# Patient Record
Sex: Female | Born: 1980 | Race: White | Hispanic: No | Marital: Married | Smoking: Current every day smoker
Health system: Southern US, Community
[De-identification: ages and names within clinical notes are randomized; demographics above are authoritative.]

## PROBLEM LIST (undated history)

## (undated) DIAGNOSIS — Z87442 Personal history of urinary calculi: Secondary | ICD-10-CM

## (undated) DIAGNOSIS — A630 Anogenital (venereal) warts: Secondary | ICD-10-CM

## (undated) DIAGNOSIS — R3915 Urgency of urination: Secondary | ICD-10-CM

## (undated) DIAGNOSIS — F419 Anxiety disorder, unspecified: Secondary | ICD-10-CM

## (undated) DIAGNOSIS — N200 Calculus of kidney: Secondary | ICD-10-CM

## (undated) HISTORY — DX: Anogenital (venereal) warts: A63.0

---
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
## 2004-05-23 ENCOUNTER — Ambulatory Visit (HOSPITAL_COMMUNITY): Admission: RE | Admit: 2004-05-23 | Discharge: 2004-05-23 | Payer: Self-pay | Admitting: Obstetrics and Gynecology

## 2004-05-27 ENCOUNTER — Emergency Department (HOSPITAL_COMMUNITY): Admission: EM | Admit: 2004-05-27 | Discharge: 2004-05-27 | Payer: Self-pay | Admitting: Emergency Medicine

## 2004-11-01 ENCOUNTER — Ambulatory Visit (HOSPITAL_COMMUNITY): Admission: AD | Admit: 2004-11-01 | Discharge: 2004-11-01 | Payer: Self-pay | Admitting: Obstetrics & Gynecology

## 2004-11-01 ENCOUNTER — Inpatient Hospital Stay (HOSPITAL_COMMUNITY): Admission: AD | Admit: 2004-11-01 | Discharge: 2004-11-04 | Payer: Self-pay | Admitting: Obstetrics and Gynecology

## 2008-02-17 ENCOUNTER — Other Ambulatory Visit: Admission: RE | Admit: 2008-02-17 | Discharge: 2008-02-17 | Payer: Self-pay | Admitting: Obstetrics & Gynecology

## 2009-02-20 ENCOUNTER — Other Ambulatory Visit: Admission: RE | Admit: 2009-02-20 | Discharge: 2009-02-20 | Payer: Self-pay | Admitting: Obstetrics & Gynecology

## 2010-06-17 ENCOUNTER — Other Ambulatory Visit (HOSPITAL_COMMUNITY)
Admission: RE | Admit: 2010-06-17 | Discharge: 2010-06-17 | Disposition: A | Discharge status: Home / Self Care | Payer: Self-pay | Source: Ambulatory Visit | Attending: Obstetrics & Gynecology | Admitting: Obstetrics & Gynecology

## 2010-06-17 DIAGNOSIS — Z01419 Encounter for gynecological examination (general) (routine) without abnormal findings: Secondary | ICD-10-CM | POA: Insufficient documentation

## 2010-09-13 NOTE — H&P (Signed)
Stacey Wilson, Stacey Wilson                ACCOUNT NO.:  [id number]   MEDICAL RECORD NO.:  [id number]          PATIENT TYPE:  INP   LOCATION:  LDR4                          FACILITY:  [facility]   PHYSICIAN:  Langley Gauss, MD     DATE OF BIRTH:  [birth year]   DATE OF ADMISSION:  11/01/2004  DATE OF DISCHARGE:  LH                                HISTORY & PHYSICAL   This is a 30 year old gravida 1, para 0 at 39 weeks' gestation.  The patient  received prenatal care through [facility].  She has had an  uncomplicated prenatal course.  Is noted to be a GBS carrier status  negative.  The patient presents p.m. November 01, 2004 complaining of an  increased frequency of intensity of uterine contractions.  The patient had  been seen in labor and delivery the a.m. of November 01, 2004, noted to not be in  active labor at that time, so was discharged to home.  She states the  contractions did subside during the day, but throughout the day intensity  and frequency did increase prompting her visit to labor and delivery.  She  denies any ruptured membranes, any vaginal bleeding, no leakage of fluid,  good fetal movement.  Contractions occurring about every 5 minutes.   PAST MEDICAL HISTORY:  The patient was treated once during the pregnancy for  a urinary tract infection.  The patient does have a history of prior vaginal  pain which makes her very apprehensive regarding any pelvic examinations.   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   PHYSICAL EXAMINATION:  GENERAL: On presentation she is noted to be in some  obvious discomfort with the uterine contractions.  Blood pressure is 125/70,  pulse rate of 80, respiratory rate 20.  HEENT: Negative.  No adenopathy.  NECK: Supple.  Thyroid is nonpalpable.  LUNGS: Clear.  CARDIOVASCULAR: Regular rate and rhythm.  ABDOMEN: Soft and nontender.  No surgical scars are identified.  She is  vertex on presentation by Leopold's maneuvers  with a fundal height of 38 cm.  PELVIC: Normal external genitalia.  No lesions or ulcerations identified.  No vaginal bleeding.  No leakage of fluid.  Cervix 3 cm dilated, about 60%  effaced with the head nonengaged.  External fetal monitor reveals a  reassuring fetal heart rate.  Uterine activity is every 3-5 minutes.   ASSESSMENT AND PLAN:  Term intrauterine pregnancy, gravida 1, para 0.  The  patient represents now in active labor.  The patient admitted.  She will  initially like a trial of narcotics, but she does plan ultimately on  receiving epidural analgesic.     DC/MEDQ  D:  11/02/2004  T:  11/02/2004  Job:  [id number]

## 2010-09-13 NOTE — Op Note (Signed)
Stacey Wilson, Stacey Wilson                ACCOUNT NO.:  [id number]   MEDICAL RECORD NO.:  [id number]          PATIENT TYPE:  INP   LOCATION:  LDR4                          FACILITY:  [facility]   PHYSICIAN:  Langley Gauss, MD     DATE OF BIRTH:  [birth year]   DATE OF PROCEDURE:  11/02/2004  DATE OF DISCHARGE:                                 OPERATIVE REPORT   PROCEDURE:  Placement of continuous lumbar epidural analgesia at the L3-L4  interspace performed by Dr. Langley Gauss.   COMPLICATIONS:  None.   SUMMARY:  Appropriate, informed consent was obtained.  The patient requested  epidural.  She was placed in a seated position.  Fetal heart rate remains  reassuring.  Bony landmarks were identified.  The L3-L4 interspace is  chosen.  The patient's back is sterilely prepped and draped utilizing the  epidural drape.  Five mL of 1% lidocaine plain was injected at the midline  in the L3-L4 interspace to raise a small skin wheel. The 17-gauge Tuohy  Shliff needle was then utilized with loss of resistance in an air-filled  syringe to atraumatically identify entry into the epidural space on the  first attempt without difficulty.  Initial test dose of 5 mL of 1.5%  lidocaine plus epinephrine was injected through the epidural catheter with  no signs of CSF.  Intravascular injection obtained.  The needle was removed.  Aspiration test is negative.  Second test dose of 2 mL of 1.5% lidocaine  plus epinephrine injected, and again, no signs of CSF or intravascular  injection obtained.  Thus, the patient was connected to the infusion pump  with a standard mixture.  She is treated with continuous infusion rate of 14  mL/hr.  Upon return to the lateral supine position with the catheter secured  into place, she is noted to have evidence of an excellent bilateral block in  place.  The cervix on examination is 7 mm dilated, completely effaced,  vertex at 0 station.  External fetal monitor is replaced with a fetal  scalp  monitor which documents reassuring fetal heart rate.  The patient continues  to contract every 3-5 minutes.       DC/MEDQ  D:  11/02/2004  T:  11/02/2004  Job:  [id number]

## 2010-09-13 NOTE — Op Note (Signed)
Stacey Wilson, Stacey Wilson                ACCOUNT NO.:  [id number]   MEDICAL RECORD NO.:  [id number]          PATIENT TYPE:  INP   LOCATION:  A410                          FACILITY:  [facility]   PHYSICIAN:  Langley Gauss, MD     DATE OF BIRTH:  27-Aug-1980   DATE OF PROCEDURE:  DATE OF DISCHARGE:                                 OPERATIVE REPORT   DIAGNOSIS:  40-week intrauterine pregnancy.   PROCEDURES PERFORMED:  1.  Precipitous nurse-assisted vaginal delivery of an 8 pound 2 ounce female      infant.  2.  Repair of second-degree perineal laceration with extension into the      vaginal wall, a length of about 3 inches.   ANALGESIA:  The patient of placement continuous lumbar epidural during the  course of labor. At time of delivery and repair of the second-degree  perineal laceration, a total of 30 mL of 1% lidocaine plain was injected.   ESTIMATED BLOOD LOSS:  Less than 500 mL.   DELIVERY PERFORMED BY:  Langley Gauss, M.D.   SPECIMENS:  Arterial cord gas and cord blood to laboratory. The placenta was  examined and noted to be apparently intact with a three-vessel umbilical  cord.   SUMMARY:  The patient had been admitted in early stages of labor with onset  of active labor. She requested epidural. Epidural placed without difficulty  and functioned well throughout the remainder of the labor course. The  patient did on some occasions require a bolus of 10 mL of 2% lidocaine  plain. Immediately following this, she was noted to be completely dilated  with vertex at a +1 station. She had no urge to push. Thus, she was managed  expectantly with the contractions resulting in further descent of the fetal  vertex. I was contacted then by the nursing assisted, stating that the nurse  wanted me to return an assess the patient. Clarification was obtained  regarding this request, the nurse stating that she felt that this patient  would require probable on vacuum-assisted delivery due to a  somewhat  narrowed pelvic outlet and no further descent of the fetal vertex. Less than  five minutes later, I was again contacted, this time given the message that  if I did not hurry and get there nearly immediately I would miss the  delivery. That this was occurred in that when I arrived in the room nurse-  assisted vaginal delivery had just been performed. The cord had been doubly  clamped and cut. I was unable thus to obtain an arterial cord gas. As it had  not been doubly clamped, cord blood was obtained. Gentle traction on the  nuchal cord resulted in separation, which upon examination appeared to be an  intact placenta with associated three-vessel umbilical cord. Excellent  uterine tone was achieved following delivery the placenta. Examination of  the genital tract revealed a second-degree perineal laceration with a  somewhat lengthy extension in the midline of the vaginal mucosa and  extension down to, but not including the rectal sphincter. There was a very  prominent edema noted  of both labia minor and labia majora with small  lacerations occurring bilaterally inside the labia majora. These were  superficial in nature and not bleeding. Thus, they were not repaired. The  repair was then performed as follows: Interrupted 2-0 Vicryl sutures were  used on the superficial transverse perineal muscle to reapproximate in the  midline, followed by a 0 chromic in a running locked fashion on the vaginal  mucosa, followed by two-layer closure of 0 chromic on the perineal body.  Despite appropriate of lidocaine, the patient complained of significant  discomfort and pain already in the area of the repair, as well as the area  where she is noted to have nonthrombosed hemorrhoids. Ice is to be applied  to this area immediately. The patient was taken out of the dorsal lithotomy  position and rolled to her side, at which time the epidural catheter was  removed with the blue tip noted to be intact.  The patient was advised not to  ambulate for several hours' duration. The Foley catheter was removed just  prior to delivery, thus for all practical purposes, her bladder is to be  considered empty. In addition, she is advised to certainly request  assistance with first ambulatory effort due to the very dense epidural block  which was noted to be in place for purposes of the labor and delivery. The  patient does plan on breast feeding.       DC/MEDQ  D:  11/02/2004  T:  11/03/2004  Job:  [id number]

## 2011-06-02 ENCOUNTER — Ambulatory Visit (HOSPITAL_COMMUNITY)
Admission: RE | Admit: 2011-06-02 | Discharge: 2011-06-02 | Disposition: A | Discharge status: Home / Self Care | Payer: PRIVATE HEALTH INSURANCE | Source: Ambulatory Visit | Attending: Family Medicine | Admitting: Family Medicine

## 2011-06-02 ENCOUNTER — Other Ambulatory Visit: Payer: Self-pay | Admitting: Family Medicine

## 2011-06-02 DIAGNOSIS — M25562 Pain in left knee: Secondary | ICD-10-CM

## 2011-06-02 DIAGNOSIS — M25569 Pain in unspecified knee: Secondary | ICD-10-CM | POA: Insufficient documentation

## 2011-07-14 ENCOUNTER — Ambulatory Visit: Payer: PRIVATE HEALTH INSURANCE | Admitting: Physical Therapy

## 2012-02-10 ENCOUNTER — Other Ambulatory Visit (HOSPITAL_COMMUNITY)
Admission: RE | Admit: 2012-02-10 | Discharge: 2012-02-10 | Disposition: A | Discharge status: Home / Self Care | Payer: BC Managed Care – PPO | Source: Ambulatory Visit | Attending: Obstetrics and Gynecology | Admitting: Obstetrics and Gynecology

## 2012-02-10 DIAGNOSIS — Z01419 Encounter for gynecological examination (general) (routine) without abnormal findings: Secondary | ICD-10-CM | POA: Insufficient documentation

## 2013-01-18 ENCOUNTER — Encounter: Payer: Self-pay | Admitting: Family Medicine

## 2013-01-18 ENCOUNTER — Ambulatory Visit (INDEPENDENT_AMBULATORY_CARE_PROVIDER_SITE_OTHER): Payer: BC Managed Care – PPO | Admitting: Family Medicine

## 2013-01-18 VITALS — BP 134/88 | Temp 98.5°F | Ht 65.0 in | Wt 175.4 lb

## 2013-01-18 DIAGNOSIS — J019 Acute sinusitis, unspecified: Secondary | ICD-10-CM

## 2013-01-18 MED ORDER — LEVOFLOXACIN 500 MG PO TABS: 500.0000 mg | ORAL_TABLET | Freq: Every day | ORAL | Status: AC

## 2013-01-18 NOTE — Progress Notes (Signed)
  Subjective:    Patient ID: Stacey Wilson, female    DOB: [birth year], 32 y.o.   MRN: [id number]  Sinusitis This is a new problem. The current episode started in the past 7 days. Associated symptoms include congestion, coughing, ear pain, headaches, shortness of breath, sinus pressure, sneezing and a sore throat. (Diarrhea, body aches, low grade fever) Past treatments include acetaminophen. The treatment provided mild relief.   Started Friday Sat /sun body aches, feels SOB at times She states she was feeling well tall this it she felt pretty lousy the past few days was unable to work or go to school she started a little bit better now  Review of Systems  HENT: Positive for ear pain, congestion, sore throat, sneezing and sinus pressure.   Respiratory: Positive for cough and shortness of breath.   Neurological: Positive for headaches.       Objective:   Physical Exam  Vitals reviewed. Constitutional: She appears well-developed and well-nourished.  HENT:  Head: Normocephalic and atraumatic.  Cardiovascular: Normal rate, regular rhythm and normal heart sounds.   No murmur heard. Pulmonary/Chest: Effort normal and breath sounds normal. No respiratory distress.  Lymphadenopathy:    She has no cervical adenopathy.          Assessment & Plan:  Sinusitis-antibiotics prescribed. Warning signs discussed. Encouraged patient to followup if any high fevers difficulty breathing or worse.

## 2013-01-21 ENCOUNTER — Telehealth: Payer: Self-pay | Admitting: *Deleted

## 2013-01-21 MED ORDER — DESOGESTREL-ETHINYL ESTRADIOL 0.15-0.02/0.01 MG (21/5) PO TABS: 1.0000 | ORAL_TABLET | Freq: Every day | ORAL | Status: DC

## 2013-01-21 NOTE — Telephone Encounter (Signed)
Has been on Ministrin birth control, but can no longer use savings card so she can't afford $50.00 copay. Can you prescribe something cheaper? Uses [facility] in [place]. Thanks!!! [employer]

## 2013-01-21 NOTE — Telephone Encounter (Signed)
Spoke with pt. Advised Dr prescribed a OCP that has the same progesterone in her other pill so she should transition fine. Advised to use backup for the 1st pack. Pt voiced understanding. JSY

## 2013-09-07 ENCOUNTER — Ambulatory Visit (INDEPENDENT_AMBULATORY_CARE_PROVIDER_SITE_OTHER): Payer: BC Managed Care – PPO | Admitting: Nurse Practitioner

## 2013-09-07 ENCOUNTER — Encounter: Payer: Self-pay | Admitting: Nurse Practitioner

## 2013-09-07 VITALS — BP 120/82 | HR 72 | Temp 98.2°F | Resp 18 | Ht 65.0 in | Wt 172.0 lb

## 2013-09-07 DIAGNOSIS — Z789 Other specified health status: Secondary | ICD-10-CM

## 2013-09-07 DIAGNOSIS — J309 Allergic rhinitis, unspecified: Secondary | ICD-10-CM

## 2013-09-07 DIAGNOSIS — Z1159 Encounter for screening for other viral diseases: Secondary | ICD-10-CM

## 2013-09-07 DIAGNOSIS — Z111 Encounter for screening for respiratory tuberculosis: Secondary | ICD-10-CM

## 2013-09-09 LAB — TB SKIN TEST
Induration: 0 mm
TB Skin Test: NEGATIVE

## 2013-09-10 LAB — RUBELLA SCREEN: RUBELLA: 1.19 {index} — AB (ref <0.90)

## 2013-09-11 ENCOUNTER — Encounter: Payer: Self-pay | Admitting: Nurse Practitioner

## 2013-09-11 NOTE — Progress Notes (Signed)
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 Does not wear contacts or glasses. Regular dental care. PE with GYN. Mild head congestion with occas sneezing. No headache or fever. No sore throat or ear pain.   Objective:   BP 120/82  Pulse 72  Temp(Src) 98.2 F (36.8 C)  Resp 18  Ht 5\' 5"  (1.651 m)  Wt 172 lb (78.019 kg)  BMI 28.62 kg/m2  LMP 08/29/2013 NAD. Alert, oriented. TMs mild clear effusion. Pharynx clear. Neck supple with mild soft adenopathy. Thyroid normal to palpation.  Lungs clear. Heart RRR. abd soft, nontender. GU and breast exam per GYN. Reflexes normal. Romberg neg.   Assessment: Allergic rhinitis  Screening-pulmonary TB - Plan: TB Skin Test  Screening for rubella - Plan: Rubella screen  Unknown varicella vaccination status - Plan: Varicella Zoster Abs, IgG/IgM  Plan: OTC meds as directed for congestion and allergies. Follow up with GYN for physical.

## 2013-09-14 ENCOUNTER — Other Ambulatory Visit: Payer: BC Managed Care – PPO

## 2013-09-21 ENCOUNTER — Other Ambulatory Visit: Payer: Self-pay | Admitting: *Deleted

## 2013-09-21 ENCOUNTER — Ambulatory Visit (INDEPENDENT_AMBULATORY_CARE_PROVIDER_SITE_OTHER): Payer: BC Managed Care – PPO | Admitting: *Deleted

## 2013-09-21 DIAGNOSIS — Z111 Encounter for screening for respiratory tuberculosis: Secondary | ICD-10-CM

## 2013-09-21 DIAGNOSIS — Z789 Other specified health status: Secondary | ICD-10-CM

## 2013-09-22 LAB — VARICELLA ZOSTER ANTIBODY, IGG: VARICELLA IGG: 1888 {index} — AB (ref <135.00)

## 2013-09-23 LAB — TB SKIN TEST
Induration: 0 mm
TB Skin Test: NEGATIVE

## 2013-11-29 ENCOUNTER — Ambulatory Visit (INDEPENDENT_AMBULATORY_CARE_PROVIDER_SITE_OTHER): Payer: BC Managed Care – PPO | Admitting: Adult Health

## 2013-11-29 ENCOUNTER — Other Ambulatory Visit (HOSPITAL_COMMUNITY)
Admission: RE | Admit: 2013-11-29 | Discharge: 2013-11-29 | Disposition: A | Discharge status: Home / Self Care | Payer: BC Managed Care – PPO | Source: Ambulatory Visit | Attending: Adult Health | Admitting: Adult Health

## 2013-11-29 ENCOUNTER — Encounter: Payer: Self-pay | Admitting: Adult Health

## 2013-11-29 VITALS — BP 110/60 | Ht 65.0 in | Wt 168.0 lb

## 2013-11-29 DIAGNOSIS — Z01419 Encounter for gynecological examination (general) (routine) without abnormal findings: Secondary | ICD-10-CM | POA: Insufficient documentation

## 2013-11-29 DIAGNOSIS — Z309 Encounter for contraceptive management, unspecified: Secondary | ICD-10-CM | POA: Insufficient documentation

## 2013-11-29 DIAGNOSIS — Z1151 Encounter for screening for human papillomavirus (HPV): Secondary | ICD-10-CM | POA: Insufficient documentation

## 2013-11-29 DIAGNOSIS — Z3041 Encounter for surveillance of contraceptive pills: Secondary | ICD-10-CM

## 2013-11-29 DIAGNOSIS — Z124 Encounter for screening for malignant neoplasm of cervix: Secondary | ICD-10-CM

## 2013-11-29 MED ORDER — DESOGESTREL-ETHINYL ESTRADIOL 0.15-0.02/0.01 MG (21/5) PO TABS: 1.0000 | ORAL_TABLET | Freq: Every day | ORAL | Status: DC

## 2013-11-29 NOTE — Progress Notes (Signed)
Subjective:     Patient ID: Stacey Wilson, female   DOB: Jul 22, 1980, 33 y.o.   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  Review of Systems See HPI Reviewed past medical,surgical, social and family history. Reviewed medications and allergies.     Objective:   Physical Exam BP 110/60  Ht 5\' 5"  (1.651 m)  Wt 168 lb (76.204 kg)  BMI 27.96 kg/m2  LMP 11/24/2013   Skin warm and dry.Pelvic: external genitalia is normal in appearance, vagina: good color, moisture and rugae, cervix: bulbous with several nabothian cysts,pap with HPV performed, uterus: normal size, shape and contour, non tender, no masses felt, adnexa: no masses or tenderness noted.  Assessment:     Pap smear only Contraceptive management    Plan:     Refilled Kariva, disp 3 packs take 1 daily with 4 refills Physical in 1 year,pap in 3 years if negative HPV Call prn, sign up for my chart

## 2013-11-29 NOTE — Patient Instructions (Signed)
Continue OCs  Physical in 1 year 

## 2013-12-01 LAB — CYTOLOGY - PAP

## 2013-12-15 ENCOUNTER — Ambulatory Visit: Payer: BC Managed Care – PPO | Admitting: Nurse Practitioner

## 2013-12-15 ENCOUNTER — Telehealth: Payer: Self-pay | Admitting: Adult Health

## 2013-12-15 NOTE — Telephone Encounter (Signed)
Spoke with pt. Pt is having a vaginal discharge with odor. Advised would need to be seen. Pt voiced understanding. Call transferred to front desk for appt. JSY

## 2013-12-16 ENCOUNTER — Encounter: Payer: Self-pay | Admitting: Obstetrics and Gynecology

## 2013-12-16 ENCOUNTER — Ambulatory Visit (INDEPENDENT_AMBULATORY_CARE_PROVIDER_SITE_OTHER): Payer: BC Managed Care – PPO | Admitting: Obstetrics and Gynecology

## 2013-12-16 VITALS — BP 120/80 | Ht 65.0 in | Wt 169.6 lb

## 2013-12-16 DIAGNOSIS — N898 Other specified noninflammatory disorders of vagina: Secondary | ICD-10-CM

## 2013-12-16 LAB — POCT WET PREP WITH KOH
BACTERIA WET PREP HPF POC: NORMAL
KOH PREP POC: NEGATIVE
RBC WET PREP PER HPF POC: NORMAL
TRICHOMONAS UA: NEGATIVE
Yeast Wet Prep HPF POC: NEGATIVE

## 2013-12-16 MED ORDER — METRONIDAZOLE 500 MG PO TABS: 500.0000 mg | ORAL_TABLET | Freq: Two times a day (BID) | ORAL | Status: DC

## 2013-12-16 NOTE — Progress Notes (Signed)
   [facility] Visit  Patient name: Stacey Wilson MRN [id number]  Date of birth: 10-04-1980  CC & HPI:  Stacey AdamMiranda Lahue is a 33 y.o. female presenting today for orange discharge and odor onset 1 week. She states that the odor will be a fishy smell. She states that she just finished her period 2 weeks ago. She states that the discharge will be present one day and then gone the next. She states that she is married. She states that she was just here for a pap smear.   ROS:  +Vaginal discharge No other complaints.   Pertinent History Reviewed:   Reviewed: Significant for  Medical         Past Medical History  Diagnosis Date  . Contraceptive management 11/29/2013                              Surgical Hx:    Past Surgical History  Procedure Laterality Date  . Breast surgery     Medications: Reviewed & Updated - see associated section                      Current outpatient prescriptions:desogestrel-ethinyl estradiol (KARIVA) 0.15-0.02/0.01 MG (21/5) tablet, Take 1 tablet by mouth daily., Disp: 3 Package, Rfl: 4   Social History: Reviewed -  reports that she has been smoking Cigarettes.  She has a 7.5 pack-year smoking history. She has never used smokeless tobacco.  Objective Findings:  Vitals: Blood pressure 120/80, height 5\' 5"  (1.651 m), weight 169 lb 9.6 oz (76.93 kg), last menstrual period 11/24/2013.  Physical Examination: General appearance - alert, well appearing, and in no distress, oriented to person, place, and time and normal appearing weight Pelvic - normal external genitalia, vulva, vagina, cervix, uterus and adnexa, VULVA: normal appearing vulva with no masses, tenderness or lesions,  VAGINA: vaginal discharge - creamy, WET MOUNT done - results: KOH done, clue cells, DNA probe for chlamydia and GC obtained, DNA probe for chlamydia and GC obtained,  CERVIX: normal appearing cervix without discharge or lesions,  UTERUS: uterus is normal size, shape, consistency and  nontender,  ADNEXA: normal adnexa in size, nontender and no masses Rectal - rectal exam not indicated   Assessment & Plan:   A:  1. Bact Vaginosis.  P:  1. Medication given for BV dx.metronidazole 500 1 tablet twice a day for seven days     This chart was scribed by Chestine SporeSoijett Blue, Medical Scribe, for Dr. Christin BachJohn Nalda Shackleford on 12/16/13 at 11:24 AM. This chart was reviewed by Dr. Christin BachJohn Tobenna Needs for accuracy.

## 2013-12-17 LAB — GC/CHLAMYDIA PROBE AMP
CT Probe RNA: NEGATIVE
GC Probe RNA: NEGATIVE

## 2013-12-19 ENCOUNTER — Ambulatory Visit (INDEPENDENT_AMBULATORY_CARE_PROVIDER_SITE_OTHER): Payer: BC Managed Care – PPO | Admitting: Nurse Practitioner

## 2013-12-19 ENCOUNTER — Encounter: Payer: Self-pay | Admitting: Nurse Practitioner

## 2013-12-19 VITALS — BP 118/78 | Ht 65.0 in | Wt 171.0 lb

## 2013-12-19 DIAGNOSIS — F411 Generalized anxiety disorder: Secondary | ICD-10-CM

## 2013-12-19 MED ORDER — CLONAZEPAM 0.5 MG PO TABS: 0.5000 mg | ORAL_TABLET | Freq: Two times a day (BID) | ORAL | Status: DC | PRN

## 2013-12-19 MED ORDER — CITALOPRAM HYDROBROMIDE 20 MG PO TABS: ORAL_TABLET | ORAL | Status: DC

## 2013-12-19 NOTE — Patient Instructions (Signed)
Melatonin 5 mg at bedtime

## 2013-12-20 ENCOUNTER — Telehealth: Payer: Self-pay | Admitting: *Deleted

## 2013-12-20 NOTE — Telephone Encounter (Signed)
Message copied by Criss Alvine on Tue Dec 20, 2013  1:07 PM ------      Message from: Tilda Burrow      Created: Mon Dec 19, 2013  6:33 AM       Treated for BV, considered lo risk for STI.       Please let pt know of negative results. ------

## 2013-12-22 ENCOUNTER — Other Ambulatory Visit: Payer: Self-pay | Admitting: Obstetrics & Gynecology

## 2013-12-23 ENCOUNTER — Encounter: Payer: Self-pay | Admitting: Nurse Practitioner

## 2013-12-23 DIAGNOSIS — F419 Anxiety disorder, unspecified: Secondary | ICD-10-CM | POA: Insufficient documentation

## 2013-12-23 NOTE — Progress Notes (Signed)
Subjective:  Presents to discuss her anxiety. Has had this for about a year, has been getting worse. Has tried to control through deep breathing and relaxation. Gets regular exercise. Rare panic attack, chills heavy where she cannot breathe. Emotional lability. Sleep disturbance, difficulty falling asleep. Is currently in nursing school. Having difficulty focusing with short-term memory loss. Denies suicidal thoughts or ideation. Some fatigue. [facility] her mother has anxiety/depression. No change in tobacco or alcohol use.  Objective:   BP 118/78  Ht  (1.651 m)  Wt 171 lb (77.565 kg)  BMI 28.46 kg/m2  LMP 11/24/2013 NAD. Alert, oriented. Mildly anxious affect. Lungs clear. Heart regular rate rhythm. Thoughts logical coherent and relevant. Dressed appropriately.  Assessment: Anxiety state, unspecified  Plan:  Meds ordered this encounter  Medications  . citalopram (CELEXA) 20 MG tablet    Sig: One po q d at bedtime    Dispense:  30 tablet    Refill:  0    Order Specific Question:  Supervising Provider    Answer:  Merlyn Albert [[id number]]  . clonazePAM (KLONOPIN) 0.5 MG tablet    Sig: Take 1 tablet (0.5 mg total) by mouth 2 (two) times daily as needed for anxiety.    Dispense:  30 tablet    Refill:  0    Order Specific Question:  Supervising Provider    Answer:  Merlyn Albert [[id number]]   Start Celexa half tab by mouth each bedtime for 6 days then if tolerated increase to one by mouth each bedtime. Reviewed potential adverse effects. DC med and call if any problems. Use Klonopin as needed for severe anxiety/panic attacks. Advised to use sparingly. Drowsiness precautions. Continue regular exercise and stress reduction techniques. Melatonin 5 mg at bedtime. Defers counseling at this time. Return in about 1 month (around 01/19/2014).

## 2014-01-03 ENCOUNTER — Telehealth: Payer: Self-pay | Admitting: *Deleted

## 2014-01-03 NOTE — Telephone Encounter (Signed)
Spoke with pt letting her know pap was normal and also GC/CHL was negative. JSY

## 2014-01-20 ENCOUNTER — Telehealth: Payer: Self-pay | Admitting: *Deleted

## 2014-01-20 ENCOUNTER — Ambulatory Visit (INDEPENDENT_AMBULATORY_CARE_PROVIDER_SITE_OTHER): Payer: BC Managed Care – PPO | Admitting: Nurse Practitioner

## 2014-01-20 ENCOUNTER — Encounter: Payer: Self-pay | Admitting: Nurse Practitioner

## 2014-01-20 VITALS — BP 114/72 | Ht 65.0 in | Wt 170.0 lb

## 2014-01-20 DIAGNOSIS — F411 Generalized anxiety disorder: Secondary | ICD-10-CM | POA: Diagnosis not present

## 2014-01-20 DIAGNOSIS — Z23 Encounter for immunization: Secondary | ICD-10-CM

## 2014-01-20 MED ORDER — CLONAZEPAM 0.5 MG PO TABS: 0.5000 mg | ORAL_TABLET | Freq: Two times a day (BID) | ORAL | Status: DC | PRN

## 2014-01-20 MED ORDER — METRONIDAZOLE 500 MG PO TABS: 500.0000 mg | ORAL_TABLET | Freq: Two times a day (BID) | ORAL | Status: DC

## 2014-01-20 MED ORDER — CITALOPRAM HYDROBROMIDE 20 MG PO TABS: ORAL_TABLET | ORAL | Status: DC

## 2014-01-20 NOTE — Patient Instructions (Addendum)
Pro B health pills or gelBacterial Vaginosis Bacterial vaginosis is a vaginal infection that occurs when the normal balance of bacteria in the vagina is disrupted. It results from an overgrowth of certain bacteria. This is the most common vaginal infection in women of childbearing age. Treatment is important to prevent complications, especially in pregnant women, as it can cause a premature delivery. CAUSES  Bacterial vaginosis is caused by an increase in harmful bacteria that are normally present in smaller amounts in the vagina. Several different kinds of bacteria can cause bacterial vaginosis. However, the reason that the condition develops is not fully understood. RISK FACTORS Certain activities or behaviors can put you at an increased risk of developing bacterial vaginosis, including:  Having a new sex partner or multiple sex partners.  Douching.  Using an intrauterine device (IUD) for contraception. Women do not get bacterial vaginosis from toilet seats, bedding, swimming pools, or contact with objects around them. SIGNS AND SYMPTOMS  Some women with bacterial vaginosis have no signs or symptoms. Common symptoms include:  Grey vaginal discharge.  A fishlike odor with discharge, especially after sexual intercourse.  Itching or burning of the vagina and vulva.  Burning or pain with urination. DIAGNOSIS  Your health care provider will take a medical history and examine the vagina for signs of bacterial vaginosis. A sample of vaginal fluid may be taken. Your health care provider will look at this sample under a microscope to check for bacteria and abnormal cells. A vaginal pH test may also be done.  TREATMENT  Bacterial vaginosis may be treated with antibiotic medicines. These may be given in the form of a pill or a vaginal cream. A second round of antibiotics may be prescribed if the condition comes back after treatment.  HOME CARE INSTRUCTIONS   Only take over-the-counter or  prescription medicines as directed by your health care provider.  If antibiotic medicine was prescribed, take it as directed. Make sure you finish it even if you start to feel better.  Do not have sex until treatment is completed.  Tell all sexual partners that you have a vaginal infection. They should see their health care provider and be treated if they have problems, such as a mild rash or itching.  Practice safe sex by using condoms and only having one sex partner. SEEK MEDICAL CARE IF:   Your symptoms are not improving after 3 days of treatment.  You have increased discharge or pain.  You have a fever. MAKE SURE YOU:   Understand these instructions.  Will watch your condition.  Will get help right away if you are not doing well or get worse. FOR MORE INFORMATION  Centers for Disease Control and Prevention, Division of STD Prevention: SolutionApps.[employer].za American Sexual Health Association (ASHA): www.ashastd.org  Document Released: 04/14/2005 Document Revised: 02/02/2013 Document Reviewed: 11/24/2012 [facility] Patient Information 2015 [place], [place]. This information is not intended to replace advice given to you by your health care provider. Make sure you discuss any questions you have with your health care provider.

## 2014-01-20 NOTE — Telephone Encounter (Signed)
Attemped x 3 to contact pt of negative CHL/GC from 12/16/2013.

## 2014-01-22 ENCOUNTER — Encounter: Payer: Self-pay | Admitting: Nurse Practitioner

## 2014-01-22 NOTE — Progress Notes (Signed)
Subjective:  Presents for followup of anxiety. Much improved on Celexa 20 mg daily. Denies any adverse affects. Still has some Klonopin left from previous prescription, tries to use this sparingly. Sleeping much improved. Also requesting a refill on her metronidazole recently prescribed by Dr. Emelda Fear for bacterial vaginosis. Has had a complete workup at his office.  Objective:   BP 114/72  Ht  (1.651 m)  Wt 170 lb (77.111 kg)  BMI 28.29 kg/m2 NAD. Alert, oriented. Lungs clear. Heart regular rate rhythm.  Assessment:  Problem List Items Addressed This Visit     Other   Anxiety state, unspecified - Primary   Relevant Medications      citalopram (CELEXA) tablet    Other Visit Diagnoses   Need for prophylactic vaccination and inoculation against influenza           Plan:  Meds ordered this encounter  Medications  . citalopram (CELEXA) 20 MG tablet    Sig: One po q d at bedtime    Dispense:  90 tablet    Refill:  1    Order Specific Question:  Supervising Provider    Answer:  Merlyn Albert [[id number]]  . clonazePAM (KLONOPIN) 0.5 MG tablet    Sig: Take 1 tablet (0.5 mg total) by mouth 2 (two) times daily as needed for anxiety.    Dispense:  30 tablet    Refill:  2    Order Specific Question:  Supervising Provider    Answer:  Merlyn Albert [[id number]]  . metroNIDAZOLE (FLAGYL) 500 MG tablet    Sig: Take 1 tablet (500 mg total) by mouth 2 (two) times daily.    Dispense:  14 tablet    Refill:  0    Order Specific Question:  Supervising Provider    Answer:  Merlyn Albert [[id number]]   Continue current medications as directed. Followup with gynecology if discharge persists. Return in about 4 months (around 05/22/2014).

## 2014-02-27 ENCOUNTER — Encounter: Payer: Self-pay | Admitting: Nurse Practitioner

## 2014-06-15 ENCOUNTER — Ambulatory Visit (INDEPENDENT_AMBULATORY_CARE_PROVIDER_SITE_OTHER): Payer: BLUE CROSS/BLUE SHIELD | Admitting: Family Medicine

## 2014-06-15 ENCOUNTER — Encounter: Payer: Self-pay | Admitting: Family Medicine

## 2014-06-15 VITALS — BP 118/80 | Temp 98.5°F | Ht 65.0 in | Wt 178.4 lb

## 2014-06-15 DIAGNOSIS — J019 Acute sinusitis, unspecified: Secondary | ICD-10-CM

## 2014-06-15 DIAGNOSIS — B9689 Other specified bacterial agents as the cause of diseases classified elsewhere: Secondary | ICD-10-CM

## 2014-06-15 MED ORDER — AMOXICILLIN-POT CLAVULANATE 875-125 MG PO TABS: 1.0000 | ORAL_TABLET | Freq: Two times a day (BID) | ORAL | Status: DC

## 2014-06-15 NOTE — Progress Notes (Signed)
   Subjective:    Patient ID: Stacey Wilson, female    DOB: 1980-10-02, 34 y.o.   MRN: [id number]  Sinusitis This is a new problem. The current episode started 1 to 4 weeks ago. The problem has been gradually worsening since onset. There has been no fever. The pain is moderate. Associated symptoms include congestion, coughing, ear pain, headaches, a hoarse voice and a sore throat. Pertinent negatives include no shortness of breath. Past treatments include oral decongestants. The treatment provided no relief.   Patient states that she has no other concerns at this time.  PMH benign  Review of Systems  Constitutional: Negative for fever and activity change.  HENT: Positive for congestion, ear pain, hoarse voice, rhinorrhea and sore throat.   Eyes: Negative for discharge.  Respiratory: Positive for cough. Negative for shortness of breath and wheezing.   Cardiovascular: Negative for chest pain.  Neurological: Positive for headaches.       Objective:   Physical Exam  Constitutional: She appears well-developed.  HENT:  Head: Normocephalic.  Nose: Nose normal.  Mouth/Throat: Oropharynx is clear and moist. No oropharyngeal exudate.  Neck: Neck supple.  Cardiovascular: Normal rate and normal heart sounds.   No murmur heard. Pulmonary/Chest: Effort normal and breath sounds normal. She has no wheezes.  Lymphadenopathy:    She has no cervical adenopathy.  Skin: Skin is warm and dry.  Nursing note and vitals reviewed.         Assessment & Plan:  Viral syndrome secondary sinusitis warning signs discussed follow-up if ongoing trouble antibiotics sent in. Follow-up sooner problems

## 2014-08-10 ENCOUNTER — Other Ambulatory Visit: Payer: Self-pay | Admitting: Nurse Practitioner

## 2014-08-16 ENCOUNTER — Encounter: Payer: Self-pay | Admitting: Family Medicine

## 2014-08-16 ENCOUNTER — Other Ambulatory Visit: Payer: Self-pay | Admitting: Family Medicine

## 2014-08-16 ENCOUNTER — Ambulatory Visit (INDEPENDENT_AMBULATORY_CARE_PROVIDER_SITE_OTHER): Payer: BLUE CROSS/BLUE SHIELD | Admitting: Family Medicine

## 2014-08-16 VITALS — BP 122/88 | Ht 65.0 in | Wt 180.0 lb

## 2014-08-16 DIAGNOSIS — Z13220 Encounter for screening for lipoid disorders: Secondary | ICD-10-CM | POA: Diagnosis not present

## 2014-08-16 DIAGNOSIS — R5383 Other fatigue: Secondary | ICD-10-CM

## 2014-08-16 DIAGNOSIS — Z139 Encounter for screening, unspecified: Secondary | ICD-10-CM

## 2014-08-16 MED ORDER — CLONAZEPAM 0.5 MG PO TABS: 0.5000 mg | ORAL_TABLET | Freq: Two times a day (BID) | ORAL | Status: DC | PRN

## 2014-08-16 MED ORDER — DULOXETINE HCL 30 MG PO CPEP: 30.0000 mg | ORAL_CAPSULE | Freq: Every day | ORAL | Status: DC

## 2014-08-16 NOTE — Progress Notes (Signed)
   Subjective:    Patient ID: Stacey Wilson, female    DOB: 01/27/1981, 34 y.o.   MRN: [id number]  HPIFollow up on depression. Taking celexa 20mg  one a day. Pt states med is not helping.   Pt states no other concerns.  Patient denies being suicidal. States medicines just seemed to help as much is used to. Initially the medicine helped but then she had fatigue and tiredness and over the past several months has not had much desire to do much of anything she does have an occasional panic spent   Review of Systems  Constitutional: Positive for fatigue. Negative for activity change and appetite change.  HENT: Negative for congestion.   Respiratory: Negative for cough.   Cardiovascular: Negative for chest pain.  Gastrointestinal: Negative for abdominal pain.  Endocrine: Negative for polydipsia and polyphagia.  Neurological: Negative for weakness.  Psychiatric/Behavioral: Negative for confusion.       Objective:   Physical Exam  Constitutional: She appears well-nourished. No distress.  Cardiovascular: Normal rate, regular rhythm and normal heart sounds.   No murmur heard. Pulmonary/Chest: Effort normal and breath sounds normal. No respiratory distress.  Musculoskeletal: She exhibits no edema.  Lymphadenopathy:    She has no cervical adenopathy.  Neurological: She is alert. She exhibits normal muscle tone.  Psychiatric: Her behavior is normal.  Vitals reviewed.         Assessment & Plan:  Fatigue Depression-Cymbalta F/u Stacey Wilson 3 weeks If persist rec psychiatry  She does not want to go to psychiatry currently she will try the medication Cymbalta 30 mg she will follow-up with Stacey Wilson few weeks the patient was told that medication typically takes 6-8 weeks to recheck the fact and if she is not doing a lot better within 8 weeks I highly recommend psychiatry. Lab work was ordered

## 2014-08-17 LAB — CBC WITH DIFFERENTIAL/PLATELET
BASOS: 0 %
Basophils Absolute: 0 10*3/uL (ref 0.0–0.2)
Eos: 1 %
Eosinophils Absolute: 0.1 10*3/uL (ref 0.0–0.4)
HEMATOCRIT: 38.7 % (ref 34.0–46.6)
HEMOGLOBIN: 12.7 g/dL (ref 11.1–15.9)
IMMATURE GRANULOCYTES: 0 %
Immature Grans (Abs): 0 10*3/uL (ref 0.0–0.1)
LYMPHS: 33 %
Lymphocytes Absolute: 2.8 10*3/uL (ref 0.7–3.1)
MCH: 29.1 pg (ref 26.6–33.0)
MCHC: 32.8 g/dL (ref 31.5–35.7)
MCV: 89 fL (ref 79–97)
MONOCYTES: 6 %
Monocytes Absolute: 0.5 10*3/uL (ref 0.1–0.9)
NEUTROS ABS: 5.1 10*3/uL (ref 1.4–7.0)
NEUTROS PCT: 60 %
Platelets: 271 10*3/uL (ref 150–379)
RBC: 4.36 x10E6/uL (ref 3.77–5.28)
RDW: 12.3 % (ref 12.3–15.4)
WBC: 8.5 10*3/uL (ref 3.4–10.8)

## 2014-08-17 LAB — LIPID PANEL
CHOLESTEROL TOTAL: 239 mg/dL — AB (ref 100–199)
Chol/HDL Ratio: 3.5 ratio units (ref 0.0–4.4)
HDL: 69 mg/dL (ref >39)
LDL Calculated: 125 mg/dL — ABNORMAL HIGH (ref 0–99)
Triglycerides: 223 mg/dL — ABNORMAL HIGH (ref 0–149)
VLDL CHOLESTEROL CAL: 45 mg/dL — AB (ref 5–40)

## 2014-08-17 LAB — TSH: TSH: 2.06 u[IU]/mL (ref 0.450–4.500)

## 2014-08-17 LAB — GLUCOSE, RANDOM: Glucose: 85 mg/dL (ref 65–99)

## 2014-08-20 ENCOUNTER — Encounter: Payer: Self-pay | Admitting: Family Medicine

## 2014-09-06 ENCOUNTER — Ambulatory Visit: Payer: BLUE CROSS/BLUE SHIELD | Admitting: Family Medicine

## 2014-09-06 DIAGNOSIS — Z029 Encounter for administrative examinations, unspecified: Secondary | ICD-10-CM

## 2014-10-03 ENCOUNTER — Ambulatory Visit (INDEPENDENT_AMBULATORY_CARE_PROVIDER_SITE_OTHER): Payer: BLUE CROSS/BLUE SHIELD | Admitting: *Deleted

## 2014-10-03 DIAGNOSIS — Z111 Encounter for screening for respiratory tuberculosis: Secondary | ICD-10-CM

## 2014-10-05 LAB — TB SKIN TEST
Induration: 0 mm
TB SKIN TEST: NEGATIVE

## 2014-12-14 ENCOUNTER — Other Ambulatory Visit: Payer: Self-pay | Admitting: Adult Health

## 2015-03-07 ENCOUNTER — Ambulatory Visit (INDEPENDENT_AMBULATORY_CARE_PROVIDER_SITE_OTHER): Payer: Self-pay | Admitting: Family Medicine

## 2015-03-07 VITALS — BP 132/84 | Temp 98.4°F | Ht 65.0 in | Wt 189.1 lb

## 2015-03-07 DIAGNOSIS — R109 Unspecified abdominal pain: Secondary | ICD-10-CM

## 2015-03-07 DIAGNOSIS — N390 Urinary tract infection, site not specified: Secondary | ICD-10-CM

## 2015-03-07 LAB — POCT URINALYSIS DIPSTICK
Spec Grav, UA: 1.015
pH, UA: 6

## 2015-03-07 MED ORDER — CIPROFLOXACIN HCL 250 MG PO TABS: 250.0000 mg | ORAL_TABLET | Freq: Two times a day (BID) | ORAL | Status: DC

## 2015-03-07 NOTE — Progress Notes (Signed)
   Subjective:    Patient ID: Stacey AdamMiranda Wilson, female    DOB: Mar 16, 1981, 34 y.o.   MRN: [id number]  Urinary Tract Infection  This is a new problem. The current episode started today. The pain is at a severity of 4/10. Associated symptoms comments: Abdominal pain . She has tried nothing for the symptoms.   Low abd pain and low , pos dysuria,  No fever or chills and no aching   Ibuprofen  Some low abdominal pain radiating towards low back  Patient states no other concerns this visit.  Review of Systems No fever no chills no rash no nausea ROS otherwise negative    Objective:   Physical Exam Alert vitals stable HEENT normal. Lungs clear heart regular in rhythm no CVA tenderness low abdomen moderate tenderness to deep palpation  Urinalysis 8-10 white blood cells per high-power field       Assessment & Plan:  Impression urinary tract infection. Multiple questions answered regarding management of symptomatology. 15 minutes spent most in discussion plan antibiotics prescribed. Symptom care discussed at length WSL

## 2015-04-26 ENCOUNTER — Ambulatory Visit (INDEPENDENT_AMBULATORY_CARE_PROVIDER_SITE_OTHER): Payer: Self-pay | Admitting: Family Medicine

## 2015-04-26 ENCOUNTER — Encounter: Payer: Self-pay | Admitting: Family Medicine

## 2015-04-26 VITALS — BP 100/72 | Temp 98.4°F | Ht 65.0 in | Wt 194.1 lb

## 2015-04-26 DIAGNOSIS — B9689 Other specified bacterial agents as the cause of diseases classified elsewhere: Secondary | ICD-10-CM

## 2015-04-26 DIAGNOSIS — R319 Hematuria, unspecified: Secondary | ICD-10-CM

## 2015-04-26 DIAGNOSIS — J019 Acute sinusitis, unspecified: Secondary | ICD-10-CM

## 2015-04-26 DIAGNOSIS — M549 Dorsalgia, unspecified: Secondary | ICD-10-CM

## 2015-04-26 LAB — POCT URINALYSIS DIPSTICK
Blood, UA: 250
PH UA: 6
Spec Grav, UA: 1.015

## 2015-04-26 MED ORDER — CIPROFLOXACIN HCL 500 MG PO TABS: 500.0000 mg | ORAL_TABLET | Freq: Two times a day (BID) | ORAL | Status: DC

## 2015-04-26 MED ORDER — PROMETHAZINE HCL 25 MG PO TABS: 25.0000 mg | ORAL_TABLET | Freq: Four times a day (QID) | ORAL | Status: DC | PRN

## 2015-04-26 MED ORDER — HYDROCODONE-ACETAMINOPHEN 5-325 MG PO TABS: ORAL_TABLET | ORAL | Status: DC

## 2015-04-26 NOTE — Progress Notes (Signed)
   Subjective:    Patient ID: Stacey Wilson, female    DOB: 30-Jul-1980, 34 y.o.   MRN: [id number]  Sinusitis This is a new problem. The current episode started in the past 7 days. The problem is unchanged. There has been no fever. Associated symptoms include headaches. Treatments tried: ibuprofen. The treatment provided no relief.   Patient states that she has nausea, back pain and pelvic pain also. Patient thinks she has a kidney stone.   Felt bad headchest Uses 800 mg every eight hrs prn     LAST wk  Left flank rad to pelvic area  incr p o intake,  Pos dysuria  Hx of stones in the past took fluids and toradol  nauesated felt badk Review of Systems  Neurological: Positive for headaches.       Objective:   Physical Exam  Alert mild malaise. Vital stable mild left CVA tenderness positive from nasal congestion pharynx normal lungs clear heart regular rhythm mild left lateral abdomen tenderness extremities urinalysis numerous red blood cells no white blood cells      Assessment & Plan:  Impression 1 rhinosinusitis #2 probable kidney stone plan increase hydration. Add hydrocodone when necessary. Antibiotics prescribed. Symptom care discussed warning signs discussed WSL

## 2015-05-07 ENCOUNTER — Other Ambulatory Visit: Payer: Self-pay | Admitting: Adult Health

## 2015-05-07 ENCOUNTER — Telehealth: Payer: Self-pay | Admitting: Family Medicine

## 2015-05-07 ENCOUNTER — Other Ambulatory Visit: Payer: Self-pay | Admitting: *Deleted

## 2015-05-07 DIAGNOSIS — N200 Calculus of kidney: Secondary | ICD-10-CM

## 2015-05-07 MED ORDER — DESOGESTREL-ETHINYL ESTRADIOL 0.15-0.02/0.01 MG (21/5) PO TABS: 1.0000 | ORAL_TABLET | Freq: Every day | ORAL | Status: DC

## 2015-05-07 NOTE — Telephone Encounter (Signed)
Patient was seen on 12/29 with kidney stones,she states has passed four stones but still has some left and wants to be referred as soon as possible for the other ones. She states cant pass them very painful now.

## 2015-05-07 NOTE — Telephone Encounter (Signed)
Called patient and informed her per Dr.Scott Luking-Urgent referral for urology for kidney stones (typically if severe kidney stone pain occurs that is bad enough to go to the hospital we advise going to the ER-certainly will try to get the patient in with urology ASAP. I recommend [facility] in [place]. Patient verbalized understanding. Urgent referral put into epic.

## 2015-05-07 NOTE — Telephone Encounter (Signed)
Urgent referral for urology for kidney stones (typically if severe kidney stone pain occurs that is bad enough to go to the hospital we advise going to the ER-certainly will try to get the patient in with urology ASAP. I recommend [facility] in [place])

## 2015-05-17 ENCOUNTER — Encounter: Payer: Self-pay | Admitting: Family Medicine

## 2015-06-27 ENCOUNTER — Encounter: Payer: Self-pay | Admitting: Nurse Practitioner

## 2015-06-27 ENCOUNTER — Ambulatory Visit (INDEPENDENT_AMBULATORY_CARE_PROVIDER_SITE_OTHER): Payer: PRIVATE HEALTH INSURANCE | Admitting: Nurse Practitioner

## 2015-06-27 ENCOUNTER — Encounter: Payer: Self-pay | Admitting: Family Medicine

## 2015-06-27 VITALS — BP 110/80 | Temp 99.2°F | Wt 187.1 lb

## 2015-06-27 DIAGNOSIS — N200 Calculus of kidney: Secondary | ICD-10-CM

## 2015-06-27 LAB — POCT URINALYSIS DIPSTICK
Blood, UA: NEGATIVE
PH UA: 5
SPEC GRAV UA: 1.025

## 2015-06-27 MED ORDER — CIPROFLOXACIN HCL 500 MG PO TABS: 500.0000 mg | ORAL_TABLET | Freq: Two times a day (BID) | ORAL | Status: DC

## 2015-06-27 MED ORDER — ONDANSETRON 8 MG PO TBDP: 8.0000 mg | ORAL_TABLET | Freq: Three times a day (TID) | ORAL | Status: DC | PRN

## 2015-06-27 MED ORDER — HYDROCODONE-ACETAMINOPHEN 10-325 MG PO TABS
1.0000 | ORAL_TABLET | Freq: Three times a day (TID) | ORAL | Status: DC | PRN
Start: 2015-06-27 — End: 2016-05-16

## 2015-06-27 MED ORDER — PROMETHAZINE HCL 25 MG PO TABS: 25.0000 mg | ORAL_TABLET | ORAL | Status: DC | PRN

## 2015-06-29 ENCOUNTER — Encounter: Payer: Self-pay | Admitting: Nurse Practitioner

## 2015-06-29 NOTE — Progress Notes (Signed)
Subjective:  Presents for c/o kidney stones. Has a bag with her that has several grey to white stones of various sizes. This is the third time, episodes years apart. Now noticing a low grade fever and urinary pressure. Will have occasional difficulty voiding but resolves. Some urgency and frequency. Nausea with occasional vomiting. Taking clear liquids well. No obvious blood in her urine. Needs meds for nausea and pain control. Has appointment on 3/8 with urology.   Objective:   BP 110/80 mmHg  Temp(Src) 99.2 F (37.3 C) (Oral)  Wt 187 lb 2 oz (84.879 kg) NAD. Alert, oriented. Lungs clear. Heart RRR. Positive bilat CVA and flank tenderness, more on the right.  Results for orders placed or performed in visit on 06/27/15  POCT urinalysis dipstick  Result Value Ref Range   Color, UA Yellow    Clarity, UA Cloudy    Glucose, UA     Bilirubin, UA     Ketones, UA     Spec Grav, UA 1.025    Blood, UA Negative    pH, UA 5.0    Protein, UA     Urobilinogen, UA     Nitrite, UA     Leukocytes, UA small (1+) (A) Negative   Attempted urine micro; very poor sample with multiple epi cells    Assessment: Kidney stones - Plan: POCT urinalysis dipstick Possible secondary pyelonephritis  Plan:  Meds ordered this encounter  Medications  . ciprofloxacin (CIPRO) 500 MG tablet    Sig: Take 1 tablet (500 mg total) by mouth 2 (two) times daily. For 7 days    Dispense:  14 tablet    Refill:  0    Order Specific Question:  Supervising Provider    Answer:  Merlyn AlbertLUKING, WILLIAM S [[id number]]  . promethazine (PHENERGAN) 25 MG tablet    Sig: Take 1 tablet (25 mg total) by mouth every 4 (four) hours as needed for nausea or vomiting. For night time use    Dispense:  30 tablet    Refill:  0    Order Specific Question:  Supervising Provider    Answer:  Merlyn AlbertLUKING, WILLIAM S [[id number]]  . HYDROcodone-acetaminophen (NORCO) 10-325 MG tablet    Sig: Take 1 tablet by mouth every 8 (eight) hours as needed.    Dispense:  30  tablet    Refill:  0    Order Specific Question:  Supervising Provider    Answer:  Merlyn AlbertLUKING, WILLIAM S [[id number]]  . ondansetron (ZOFRAN-ODT) 8 MG disintegrating tablet    Sig: Take 1 tablet (8 mg total) by mouth every 8 (eight) hours as needed for nausea or vomiting. For daytime use    Dispense:  30 tablet    Refill:  0    Order Specific Question:  Supervising Provider    Answer:  Merlyn AlbertLUKING, WILLIAM S [[id number]]   Warning signs reviewed. Go to ED if symptoms worsen.  Return if symptoms worsen or fail to improve.

## 2015-07-03 ENCOUNTER — Ambulatory Visit (INDEPENDENT_AMBULATORY_CARE_PROVIDER_SITE_OTHER): Payer: PRIVATE HEALTH INSURANCE | Admitting: Adult Health

## 2015-07-03 ENCOUNTER — Encounter: Payer: Self-pay | Admitting: Adult Health

## 2015-07-03 VITALS — BP 124/80 | HR 74 | Ht 65.25 in | Wt 189.0 lb

## 2015-07-03 DIAGNOSIS — A630 Anogenital (venereal) warts: Secondary | ICD-10-CM | POA: Diagnosis not present

## 2015-07-03 DIAGNOSIS — Z01419 Encounter for gynecological examination (general) (routine) without abnormal findings: Secondary | ICD-10-CM

## 2015-07-03 DIAGNOSIS — Z01411 Encounter for gynecological examination (general) (routine) with abnormal findings: Secondary | ICD-10-CM

## 2015-07-03 DIAGNOSIS — L298 Other pruritus: Secondary | ICD-10-CM

## 2015-07-03 DIAGNOSIS — Z3041 Encounter for surveillance of contraceptive pills: Secondary | ICD-10-CM

## 2015-07-03 DIAGNOSIS — N898 Other specified noninflammatory disorders of vagina: Secondary | ICD-10-CM | POA: Insufficient documentation

## 2015-07-03 HISTORY — DX: Anogenital (venereal) warts: A63.0

## 2015-07-03 MED ORDER — DESOGESTREL-ETHINYL ESTRADIOL 0.15-0.02/0.01 MG (21/5) PO TABS: 1.0000 | ORAL_TABLET | Freq: Every day | ORAL | Status: DC

## 2015-07-03 MED ORDER — IMIQUIMOD 5 % EX CREA: TOPICAL_CREAM | CUTANEOUS | Status: DC

## 2015-07-03 MED ORDER — TRIAMCINOLONE ACETONIDE 0.5 % EX OINT: 1.0000 "application " | TOPICAL_OINTMENT | Freq: Two times a day (BID) | CUTANEOUS | Status: DC

## 2015-07-03 NOTE — Progress Notes (Signed)
Patient ID: Stacey Wilson Maraj, female   DOB: 1981/02/05, 35 y.o.   MRN: [id number] History of Present Illness: Stacey Wilson is a 35 year old white female, married, G1P1, in for a well woman gyn exam, she had normal pap with negative HPV in 11/29/13.She complains of bumps in vagina area and itching,she is having kidney stones and sees urologist in am.She is off her OCs, for about 3 months, but wants them refilled. PCP is Dr Gerda DissLuking and they have managed her pain with the kidney stones.   Current Medications, Allergies, Past Medical History, Past Surgical History, Family History and Social History were reviewed in [facility] electronic medical record.     Review of Systems: Patient denies any headaches, hearing loss, fatigue, blurred vision, shortness of breath, chest pain, problems with bowel movements, or intercourse. No joint pain or mood swings.See HPI for positives.    Physical Exam:BP 124/80 mmHg  Pulse 74  Ht 5' 5.25" (1.657 m)  Wt 189 lb (85.73 kg)  BMI 31.22 kg/m2  LMP 06/13/2015 General:  Well developed, well nourished, no acute distress Skin:  Warm and dry Neck:  Midline trachea, normal thyroid, good ROM, no lymphadenopathy Lungs; Clear to auscultation bilaterally Breast:  No dominant palpable mass, retraction, or nipple discharge Cardiovascular: Regular rate and rhythm Abdomen:  Soft, non tender, no hepatosplenomegaly Pelvic:  External genitalia is normal in appearance, but has 3 warts at top of clitoris area, and razor rash.  The vagina is normal in appearance. Urethra has no lesions or masses. The cervix is bulbous.  Uterus is felt to be normal size, shape, and contour.  No adnexal masses or tenderness noted.Bladder is mildly tender, no masses felt. Extremities/musculoskeletal:  No swelling or varicosities noted, no clubbing or cyanosis Psych:  No mood changes, alert and cooperative,seems happy Use new razor for crotch area, will give aldara for warts.  Impression: Well woman gyn  exam no pap Contraceptive management Warts Vaginal itching    Plan: Rx viorel disp 3 packs take 1 daily with 4 refills Rx Aldara #12 with 1 refill use 3 x weekly Follow up in 3 months Pap and physical in 1 year Rx kenalog ointment use 2-3 x daily prn for itching

## 2015-07-03 NOTE — Patient Instructions (Signed)
Genital Warts Genital warts are a common STD (sexually transmitted disease). They may appear as small bumps on the tissues of the genital area or anal area. Sometimes, they can become irritated and cause pain. Genital warts are easily passed to other people through sexual contact. Getting treatment is important because genital warts can lead to other problems. In females, the virus that causes genital warts may increase the risk of cervical cancer. CAUSES Genital warts are caused by a virus that is called human papillomavirus (HPV). HPV is spread by having unprotected sex with an infected person. It can be spread through vaginal, anal, and oral sex. Many people do not know that they are infected. They may be infected for years without problems. However, even if they do not have problems, they can pass the infection to their sexual partners. RISK FACTORS Genital warts are more likely to develop in:  People who have unprotected sex.  People who have multiple sexual partners.  People who become sexually active before they are 33 years of age.  Men who are not circumcised.  Women who have a female sexual partner who is not circumcised.  People who have a weakened body defense system (immune system) due to disease or medicine.  People who smoke. SYMPTOMS Symptoms of genital warts include:  Small growths in the genital area or anal area. These warts often grow in clusters.  Itching and irritation in the genital area or anal area.  Bleeding from the warts.  Painful sexual intercourse. DIAGNOSIS Genital warts can usually be diagnosed from their appearance on the vagina, vulva, penis, perineum, anus, or rectum. Tests may also be done, such as:  Biopsy. A tissue sample is removed so it can be looked at under a microscope.  Colposcopy. In females, a magnifying tool is used to examine the vagina and cervix. Certain solutions may be used to make the HPV cells change color so they can be seen more  easily.  A Pap test in females.  Tests for other STDs. TREATMENT Treatment for genital warts may include:  Applying prescription medicines to the warts. These may be solutions or creams.  Freezing the warts with liquid nitrogen (cryotherapy).  Burning the warts with:  Laser treatment.  An electrified probe (electrocautery).  Injecting a substance (Candida antigen or Trichophyton antigen) into the warts to help the body's immune system to fight off the warts.  Interferon injections.  Surgery to remove the warts. HOME CARE INSTRUCTIONS Medicines  Apply over-the-counter and prescription medicines only as told by your health care provider.  Do not treat genital warts with medicines that are used for treating hand warts.  Talk with your health care provider about using over-the-counter anti-itch creams. General Instructions  Do not touch or scratch the warts.  Do not have sex until your treatment has been completed.  Tell your current and past sexual partners about your condition because they may also need treatment.  Keep all follow-up visits as told by your health care provider. This is important.  After treatment, use condoms during sex to prevent future infections. Other Instructions for Women  Women who have genital warts might need increased screening for cervical cancer. This type of cancer is slow growing and can be cured if it is found early. Chances of developing cervical cancer are increased with HPV.  If you become pregnant, tell your health care provider that you have had HPV. Your health care provider will monitor you closely during pregnancy to be sure that your  baby is safe. PREVENTION Talk with your health care provider about getting the HPV vaccines. These vaccines prevent some HPV infections and cancers. It is recommended that the vaccine be given to males and females who are 599-426 years of age. It will not work if you already have HPV, and it is not  recommended for pregnant women. SEEK MEDICAL CARE IF:  You have redness, swelling, or pain in the area of the treated skin.  You have a fever.  You feel generally ill.  You feel lumps in and around your genital area or anal area.  You have bleeding in your genital area or anal area.  You have pain during sexual intercourse.   This information is not intended to replace advice given to you by your health care provider. Make sure you discuss any questions you have with your health care provider.   Document Released: 04/11/2000 Document Revised: 01/03/2015 Document Reviewed: 07/10/2014 Elsevier Interactive Patient Education 2016 Elsevier Inc. Use aldara 3 x weekly Follow up in 3 months Use new razor, and antibacterial soap bar Pap and physical in 1 year Use condoms

## 2015-07-04 ENCOUNTER — Ambulatory Visit (HOSPITAL_COMMUNITY)
Admission: RE | Admit: 2015-07-04 | Discharge: 2015-07-04 | Disposition: A | Discharge status: Home / Self Care | Payer: PRIVATE HEALTH INSURANCE | Source: Ambulatory Visit | Attending: Urology | Admitting: Urology

## 2015-07-04 ENCOUNTER — Other Ambulatory Visit: Payer: Self-pay | Admitting: Urology

## 2015-07-04 ENCOUNTER — Ambulatory Visit (INDEPENDENT_AMBULATORY_CARE_PROVIDER_SITE_OTHER): Payer: PRIVATE HEALTH INSURANCE | Admitting: Urology

## 2015-07-04 DIAGNOSIS — N200 Calculus of kidney: Secondary | ICD-10-CM | POA: Diagnosis present

## 2015-07-12 ENCOUNTER — Telehealth: Payer: Self-pay

## 2015-07-12 NOTE — Telephone Encounter (Signed)
Patient is returning your call regarding surgery scheduling. I did not see any notes to give patient. Please call [phone].

## 2015-07-13 NOTE — Telephone Encounter (Signed)
Pt advised of pre op date/time and sx date. Sx: 07/25/15 @ 12:45am with Dr Ronne BinningMcKenzie @ [facility]. Pre op: 07/19/15 @ 2:45pm Post op: 08/08/15 @ 2:00pm with Dr Ronne BinningMcKenzie.   Patient advised to call [facility] with any medical questions prior to surgery.

## 2015-07-18 NOTE — Patient Instructions (Signed)
Stacey Wilson  07/18/2015     @   Your procedure is scheduled on 07/25/2015.  Report to [facility] at 11:15 A.M.  Call this number if you have problems the morning of surgery:  [phone]   Remember:  Do not eat food or drink liquids after midnight.  Take these medicines the morning of surgery with A SIP OF WATER Klonopin, Birth control, Zofran or Phenergan if needed   Do not wear jewelry, make-up or nail polish.  Do not wear lotions, powders, or perfumes.  You may wear deodorant.  Do not shave 48 hours prior to surgery.  Men may shave face and neck.  Do not bring valuables to the hospital.  [facility] is not responsible for any belongings or valuables.  Contacts, dentures or bridgework may not be worn into surgery.  Leave your suitcase in the car.  After surgery it may be brought to your room.  For patients admitted to the hospital, discharge time will be determined by your treatment team.  Patients discharged the day of surgery will not be allowed to drive home.    Please read over the following fact sheets that you were given. Surgical Site Infection Prevention and Anesthesia Post-op Instructions     PATIENT INSTRUCTIONS POST-ANESTHESIA  IMMEDIATELY FOLLOWING SURGERY:  Do not drive or operate machinery for the first twenty four hours after surgery.  Do not make any important decisions for twenty four hours after surgery or while taking narcotic pain medications or sedatives.  If you develop intractable nausea and vomiting or a severe headache please notify your doctor immediately.  FOLLOW-UP:  Please make an appointment with your surgeon as instructed. You do not need to follow up with anesthesia unless specifically instructed to do so.  WOUND CARE INSTRUCTIONS (if applicable):  Keep a dry clean dressing on the anesthesia/puncture wound site if there is drainage.  Once the wound has quit draining you may leave it open to air.  Generally you should  leave the bandage intact for twenty four hours unless there is drainage.  If the epidural site drains for more than 36-48 hours please call the anesthesia department.  QUESTIONS?:  Please feel free to call your physician or the hospital operator if you have any questions, and they will be happy to assist you.      Cystoscopy Cystoscopy is a procedure that is used to help your caregiver diagnose and sometimes treat conditions that affect your lower urinary tract. Your lower urinary tract includes your bladder and the tube through which urine passes from your bladder out of your body (urethra). Cystoscopy is performed with a thin, tube-shaped instrument (cystoscope). The cystoscope has lenses and a light at the end so that your caregiver can see inside your bladder. The cystoscope is inserted at the entrance of your urethra. Your caregiver guides it through your urethra and into your bladder. There are two main types of cystoscopy:  Flexible cystoscopy (with a flexible cystoscope).  Rigid cystoscopy (with a rigid cystoscope). Cystoscopy may be recommended for many conditions, including:  Urinary tract infections.  Blood in your urine (hematuria).  Loss of bladder control (urinary incontinence) or overactive bladder.  Unusual cells found in a urine sample.  Urinary blockage.  Painful urination. Cystoscopy may also be done to remove a sample of your tissue to be checked under a microscope (biopsy). It may also be done to remove or destroy bladder stones. LET YOUR CAREGIVER KNOW ABOUT:  Allergies to food  or medicine.  Medicines taken, including vitamins, herbs, eyedrops, over-the-counter medicines, and creams.  Use of steroids (by mouth or creams).  Previous problems with anesthetics or numbing medicines.  History of bleeding problems or blood clots.  Previous surgery.  Other health problems, including diabetes and kidney problems.  Possibility of pregnancy, if this  applies. PROCEDURE The area around the opening to your urethra will be cleaned. A medicine to numb your urethra (local anesthetic) is used. If a tissue sample or stone is removed during the procedure, you may be given a medicine to make you sleep (general anesthetic). Your caregiver will gently insert the tip of the cystoscope into your urethra. The cystoscope will be slowly glided through your urethra and into your bladder. Sterile fluid will flow through the cystoscope and into your bladder. The fluid will expand and stretch your bladder. This gives your caregiver a better view of your bladder walls. The procedure lasts about 15-20 minutes. AFTER THE PROCEDURE If a local anesthetic is used, you will be allowed to go home as soon as you are ready. If a general anesthetic is used, you will be taken to a recovery area until you are stable. You may have temporary bleeding and burning on urination.   This information is not intended to replace advice given to you by your health care provider. Make sure you discuss any questions you have with your health care provider.   Document Released: 04/11/2000 Document Revised: 05/05/2014 Document Reviewed: 10/06/2011 Elsevier Interactive Patient Education [employer] Elsevier Inc.

## 2015-07-19 ENCOUNTER — Encounter (HOSPITAL_COMMUNITY)
Admission: RE | Admit: 2015-07-19 | Discharge: 2015-07-19 | Disposition: A | Discharge status: Home / Self Care | Payer: PRIVATE HEALTH INSURANCE | Source: Ambulatory Visit | Attending: Urology | Admitting: Urology

## 2015-07-19 ENCOUNTER — Encounter (HOSPITAL_COMMUNITY): Payer: Self-pay

## 2015-07-19 ENCOUNTER — Other Ambulatory Visit: Payer: Self-pay

## 2015-07-19 DIAGNOSIS — Z01812 Encounter for preprocedural laboratory examination: Secondary | ICD-10-CM | POA: Insufficient documentation

## 2015-07-19 DIAGNOSIS — N200 Calculus of kidney: Secondary | ICD-10-CM

## 2015-07-19 HISTORY — DX: Anxiety disorder, unspecified: F41.9

## 2015-07-19 LAB — CBC WITH DIFFERENTIAL/PLATELET
BASOS ABS: 0 10*3/uL (ref 0.0–0.1)
Basophils Relative: 0 %
Eosinophils Absolute: 0 10*3/uL (ref 0.0–0.7)
Eosinophils Relative: 1 %
HEMATOCRIT: 38.4 % (ref 36.0–46.0)
HEMOGLOBIN: 13 g/dL (ref 12.0–15.0)
LYMPHS ABS: 1.9 10*3/uL (ref 0.7–4.0)
LYMPHS PCT: 24 %
MCH: 30.1 pg (ref 26.0–34.0)
MCHC: 33.9 g/dL (ref 30.0–36.0)
MCV: 88.9 fL (ref 78.0–100.0)
Monocytes Absolute: 0.6 10*3/uL (ref 0.1–1.0)
Monocytes Relative: 7 %
NEUTROS ABS: 5.4 10*3/uL (ref 1.7–7.7)
NEUTROS PCT: 68 %
PLATELETS: 244 10*3/uL (ref 150–400)
RBC: 4.32 MIL/uL (ref 3.87–5.11)
RDW: 12.6 % (ref 11.5–15.5)
WBC: 8 10*3/uL (ref 4.0–10.5)

## 2015-07-19 LAB — HCG, SERUM, QUALITATIVE: PREG SERUM: NEGATIVE

## 2015-07-19 NOTE — Pre-Procedure Instructions (Signed)
Patient given information to sign up for my chart at home. 

## 2015-07-20 ENCOUNTER — Telehealth: Payer: Self-pay | Admitting: Urology

## 2015-07-20 NOTE — Telephone Encounter (Signed)
No authorization is required for outpatient services for DOS: 07/25/15 with Dr Ronne BinningMcKenzie per Daryll DrownLinda B with [employer] @ [phone] 07/20/15 @ 2:17 pm. Only Dialysis authorization is required.  Pt advised of pre op date/time and sx date. Sx: 07/25/15 @ 12:45 with Dr Ron[facility]McKenzie [facility]. Cystoscopy, Bilateral retrograde, bilateral stone extraction with laser, bilateral stent placement. Pre op: 07/19/15 @ 2:45 pm--[place]. Post op: 08/08/15 @ 2:00 pm.

## 2015-07-25 ENCOUNTER — Encounter (HOSPITAL_COMMUNITY): Payer: Self-pay | Admitting: *Deleted

## 2015-07-25 ENCOUNTER — Encounter (HOSPITAL_COMMUNITY)
Admission: RE | Disposition: A | Discharge status: Home / Self Care | Payer: Self-pay | Source: Ambulatory Visit | Attending: Urology

## 2015-07-25 ENCOUNTER — Ambulatory Visit (HOSPITAL_COMMUNITY): Payer: PRIVATE HEALTH INSURANCE | Admitting: Anesthesiology

## 2015-07-25 ENCOUNTER — Ambulatory Visit (HOSPITAL_COMMUNITY)
Admission: RE | Admit: 2015-07-25 | Discharge: 2015-07-25 | Disposition: A | Discharge status: Home / Self Care | Payer: PRIVATE HEALTH INSURANCE | Source: Ambulatory Visit | Attending: Urology | Admitting: Urology

## 2015-07-25 ENCOUNTER — Ambulatory Visit (HOSPITAL_COMMUNITY): Payer: PRIVATE HEALTH INSURANCE

## 2015-07-25 DIAGNOSIS — Z79899 Other long term (current) drug therapy: Secondary | ICD-10-CM | POA: Insufficient documentation

## 2015-07-25 DIAGNOSIS — Z87442 Personal history of urinary calculi: Secondary | ICD-10-CM | POA: Diagnosis not present

## 2015-07-25 DIAGNOSIS — R109 Unspecified abdominal pain: Secondary | ICD-10-CM | POA: Diagnosis not present

## 2015-07-25 DIAGNOSIS — Z888 Allergy status to other drugs, medicaments and biological substances status: Secondary | ICD-10-CM | POA: Insufficient documentation

## 2015-07-25 DIAGNOSIS — F17210 Nicotine dependence, cigarettes, uncomplicated: Secondary | ICD-10-CM | POA: Diagnosis not present

## 2015-07-25 DIAGNOSIS — F419 Anxiety disorder, unspecified: Secondary | ICD-10-CM | POA: Insufficient documentation

## 2015-07-25 DIAGNOSIS — N200 Calculus of kidney: Secondary | ICD-10-CM | POA: Insufficient documentation

## 2015-07-25 DIAGNOSIS — N201 Calculus of ureter: Secondary | ICD-10-CM

## 2015-07-25 HISTORY — PX: STONE EXTRACTION WITH BASKET: SHX5318

## 2015-07-25 HISTORY — PX: CYSTOSCOPY WITH RETROGRADE PYELOGRAM, URETEROSCOPY AND STENT PLACEMENT: SHX5789

## 2015-07-25 SURGERY — CYSTOURETEROSCOPY, WITH RETROGRADE PYELOGRAM AND STENT INSERTION
Anesthesia: General | Laterality: Bilateral

## 2015-07-25 MED ORDER — LIDOCAINE HCL 1 % IJ SOLN
INTRAMUSCULAR | Status: DC | PRN
Start: 2015-07-25 — End: 2015-07-25
  Administered 2015-07-25: 25 mg via INTRADERMAL

## 2015-07-25 MED ORDER — SODIUM CHLORIDE 0.9 % IR SOLN
Status: DC | PRN
  Administered 2015-07-25 (×2): 3000 mL

## 2015-07-25 MED ORDER — FENTANYL CITRATE (PF) 100 MCG/2ML IJ SOLN
INTRAMUSCULAR | Status: AC
  Filled 2015-07-25: qty 4

## 2015-07-25 MED ORDER — CEFAZOLIN SODIUM 1-5 GM-% IV SOLN
1.0000 g | INTRAVENOUS | Status: AC
  Administered 2015-07-25: 1 g via INTRAVENOUS

## 2015-07-25 MED ORDER — IOHEXOL 350 MG/ML SOLN
INTRAVENOUS | Status: DC | PRN
  Administered 2015-07-25: 5 mL via URETHRAL

## 2015-07-25 MED ORDER — ONDANSETRON HCL 4 MG/2ML IJ SOLN
4.0000 mg | Freq: Once | INTRAMUSCULAR | Status: AC
  Administered 2015-07-25: 4 mg via INTRAVENOUS

## 2015-07-25 MED ORDER — MIDAZOLAM HCL 2 MG/2ML IJ SOLN
INTRAMUSCULAR | Status: AC
  Filled 2015-07-25: qty 2

## 2015-07-25 MED ORDER — LACTATED RINGERS IV SOLN
INTRAVENOUS | Status: DC
  Administered 2015-07-25: 12:00:00 via INTRAVENOUS

## 2015-07-25 MED ORDER — OXYCODONE-ACETAMINOPHEN 5-325 MG PO TABS: 1.0000 | ORAL_TABLET | ORAL | Status: DC | PRN

## 2015-07-25 MED ORDER — ONDANSETRON 4 MG PO TBDP: 4.0000 mg | ORAL_TABLET | Freq: Three times a day (TID) | ORAL | Status: DC | PRN

## 2015-07-25 MED ORDER — MIDAZOLAM HCL 2 MG/2ML IJ SOLN
1.0000 mg | INTRAMUSCULAR | Status: DC | PRN
  Administered 2015-07-25: 2 mg via INTRAVENOUS

## 2015-07-25 MED ORDER — SULFAMETHOXAZOLE-TRIMETHOPRIM 800-160 MG PO TABS
1.0000 | ORAL_TABLET | Freq: Two times a day (BID) | ORAL | Status: DC
Start: 2015-07-25 — End: 2016-05-09

## 2015-07-25 MED ORDER — LIDOCAINE HCL (PF) 1 % IJ SOLN
INTRAMUSCULAR | Status: AC
  Filled 2015-07-25: qty 5

## 2015-07-25 MED ORDER — FENTANYL CITRATE (PF) 100 MCG/2ML IJ SOLN: 25.0000 ug | INTRAMUSCULAR | Status: DC | PRN

## 2015-07-25 MED ORDER — PROPOFOL 10 MG/ML IV BOLUS
INTRAVENOUS | Status: DC | PRN
Start: 2015-07-25 — End: 2015-07-25
  Administered 2015-07-25: 170 mg via INTRAVENOUS

## 2015-07-25 MED ORDER — ONDANSETRON HCL 4 MG/2ML IJ SOLN
INTRAMUSCULAR | Status: AC
  Filled 2015-07-25: qty 2

## 2015-07-25 MED ORDER — MIDAZOLAM HCL 5 MG/5ML IJ SOLN
INTRAMUSCULAR | Status: DC | PRN
  Administered 2015-07-25: 2 mg via INTRAVENOUS

## 2015-07-25 MED ORDER — ONDANSETRON HCL 4 MG/2ML IJ SOLN: 4.0000 mg | Freq: Once | INTRAMUSCULAR | Status: DC | PRN

## 2015-07-25 MED ORDER — CEFAZOLIN SODIUM 1-5 GM-% IV SOLN
INTRAVENOUS | Status: AC
  Filled 2015-07-25: qty 50

## 2015-07-25 MED ORDER — FENTANYL CITRATE (PF) 100 MCG/2ML IJ SOLN
INTRAMUSCULAR | Status: DC | PRN
  Administered 2015-07-25 (×2): 50 ug via INTRAVENOUS

## 2015-07-25 SURGICAL SUPPLY — 24 items
BAG HAMPER (MISCELLANEOUS) ×2 IMPLANT
BAG URO CATCHER STRL LF (MISCELLANEOUS) ×2 IMPLANT
CATH INTERMIT  6FR 70CM (CATHETERS) ×2 IMPLANT
CLOTH BEACON ORANGE TIMEOUT ST (SAFETY) ×2 IMPLANT
EXTRACTOR STONE NITINOL NGAGE (UROLOGICAL SUPPLIES) ×2 IMPLANT
FIBER LASER FLEXIVA 365 (UROLOGICAL SUPPLIES) IMPLANT
GLOVE BIO SURGEON STRL SZ8 (GLOVE) ×2 IMPLANT
GOWN STRL REUS W/ TWL LRG LVL3 (GOWN DISPOSABLE) ×1 IMPLANT
GOWN STRL REUS W/ TWL XL LVL3 (GOWN DISPOSABLE) ×1 IMPLANT
GOWN STRL REUS W/TWL LRG LVL3 (GOWN DISPOSABLE) ×1
GOWN STRL REUS W/TWL XL LVL3 (GOWN DISPOSABLE) ×1
GUIDEWIRE ANG ZIPWIRE 038X150 (WIRE) ×2 IMPLANT
GUIDEWIRE STR DUAL SENSOR (WIRE) IMPLANT
GUIDEWIRE STR ZIPWIRE 035X150 (MISCELLANEOUS) ×2 IMPLANT
IV NS IRRIG 3000ML ARTHROMATIC (IV SOLUTION) ×4 IMPLANT
LASER FIBER DISP (UROLOGICAL SUPPLIES) IMPLANT
MANIFOLD NEPTUNE II (INSTRUMENTS) IMPLANT
PACK CYSTO (CUSTOM PROCEDURE TRAY) ×2 IMPLANT
PAD ARMBOARD 7.5X6 YLW CONV (MISCELLANEOUS) ×2 IMPLANT
SHEATH ACCESS URETERAL 38CM (SHEATH) ×2 IMPLANT
STENT URET 6FRX26 CONTOUR (STENTS) ×4 IMPLANT
SYRINGE 10CC LL (SYRINGE) ×2 IMPLANT
TOWEL OR 17X26 4PK STRL BLUE (TOWEL DISPOSABLE) ×2 IMPLANT
TUBE FEEDING 8FR 16IN STR KANG (MISCELLANEOUS) IMPLANT

## 2015-07-25 NOTE — Transfer of Care (Signed)
Immediate Anesthesia Transfer of Care Note  Patient: Stacey Wilson  Procedure(s) Performed: Procedure(s): CYSTOSCOPY WITH BILATERAL RETROGRADE PYELOGRAM, BILATERAL URETEROSCOPY AND BILATERAL STENT PLACEMENT (Bilateral) BILATERAL URETERAL STONE EXTRACTION WITH BASKET (Bilateral)  Patient Location: PACU  Anesthesia Type:General  Level of Consciousness: awake, alert  and patient cooperative  Airway & Oxygen Therapy: Patient Spontanous Breathing and Patient connected to face mask oxygen  Post-op Assessment: Report given to RN, Post -op Vital signs reviewed and stable and Patient moving all extremities  Post vital signs: Reviewed and stable  Last Vitals:  Filed Vitals:   07/25/15 1230 07/25/15 1235  BP: 117/80 118/80  Pulse:    Temp:    Resp: 19 24    Complications: No apparent anesthesia complications

## 2015-07-25 NOTE — Discharge Instructions (Signed)
Ureteroscopy, Care After °Refer to this sheet in the next few weeks. These instructions provide you with information on caring for yourself after your procedure. Your health care provider may also give you more specific instructions. Your treatment has been planned according to current medical practices, but problems sometimes occur. Call your health care provider if you have any problems or questions after your procedure.  °WHAT TO EXPECT AFTER THE PROCEDURE  °After your procedure, it is typical to have the following:  °· A burning sensation when you urinate. °· Blood in your urine. °HOME CARE INSTRUCTIONS  °· Only take medicines as directed by your health care provider. Do not take any over-the-counter pain medication unless your health care provider says it is okay. °· Take a warm bath or hold a warm washcloth over your groin to relieve burning. °· Drink enough fluids to keep your urine clear or pale yellow. °¨ Drink two 8-ounce glasses of water every hour for the first 2 hours after you get home. °¨ Continue to drink water often at home. °· You can eat what you usually do. °· Ask your surgeon when you can do your usual activities. °· If you had a tube placed to keep urine flowing (ureteral stent), ask your health care provider when you need to return to have it removed. °· Keep all follow-up appointments. °SEEK MEDICAL CARE IF:  °· You have chills or fever. °· You have burning pain for longer than 24 hours after the procedure. °· You have blood in your urine for longer than 24 hours after the procedure. °SEEK IMMEDIATE MEDICAL CARE IF:  °· You have large amounts of blood or clots in your urine. °· You have very bad pain. °· You have chest pain or trouble breathing. °  °This information is not intended to replace advice given to you by your health care provider. Make sure you discuss any questions you have with your health care provider. °  °Document Released: 04/19/2013 Document Reviewed: 04/19/2013 °Elsevier  Interactive Patient Education ©2016 [employer] ° °

## 2015-07-25 NOTE — Anesthesia Postprocedure Evaluation (Signed)
Anesthesia Post Note  Patient: Stacey Wilson  Procedure(s) Performed: Procedure(s) (LRB): CYSTOSCOPY WITH BILATERAL RETROGRADE PYELOGRAM, BILATERAL URETEROSCOPY AND BILATERAL STENT PLACEMENT (Bilateral) BILATERAL RENAL STONE EXTRACTION WITH BASKET (Bilateral)  Patient location during evaluation: PACU Anesthesia Type: General Level of consciousness: awake and alert and patient cooperative Pain management: pain level controlled Vital Signs Assessment: post-procedure vital signs reviewed and stable Respiratory status: spontaneous breathing, nonlabored ventilation and respiratory function stable Cardiovascular status: stable Postop Assessment: no signs of nausea or vomiting Anesthetic complications: no    Last Vitals:  Filed Vitals:   07/25/15 1230 07/25/15 1235  BP: 117/80 118/80  Pulse:    Temp:    Resp: 19 24    Last Pain: There were no vitals filed for this visit.               Takeria Marquina J

## 2015-07-25 NOTE — H&P (Signed)
Urology Admission H&P  Chief Complaint: bilateral flank painj  History of Present Illness: [name] is a 35yo who has bilateral renal calculi. She has a long hx of nephrolithaisis. She does not know the composition. She denies LUTS.  Past Medical History  Diagnosis Date  . Contraceptive management 11/29/2013  . Kidney stones   . Warts, genital 07/03/2015  . Itching in the vaginal area 07/03/2015  . Anxiety    Past Surgical History  Procedure Laterality Date  . Breast surgery Right     benign cyst    Home Medications:  Prescriptions prior to admission  Medication Sig Dispense Refill Last Dose  . clonazePAM (KLONOPIN) 0.5 MG tablet Take 1 tablet (0.5 mg total) by mouth 2 (two) times daily as needed for anxiety. 30 tablet 2 07/24/2015 at Unknown time  . desogestrel-ethinyl estradiol (KARIVA,AZURETTE,MIRCETTE) 0.15-0.02/0.01 MG (21/5) tablet Take 1 tablet by mouth daily. 3 Package 4 07/24/2015 at Unknown time  . HYDROcodone-acetaminophen (NORCO) 10-325 MG tablet Take 1 tablet by mouth every 8 (eight) hours as needed. 30 tablet 0 Past Week at Unknown time  . imiquimod (ALDARA) 5 % cream Apply topically 3 (three) times a week. 12 each 1   . promethazine (PHENERGAN) 25 MG tablet Take 1 tablet (25 mg total) by mouth every 4 (four) hours as needed for nausea or vomiting. For night time use 30 tablet 0 Past Week at Unknown time  . triamcinolone ointment (KENALOG) 0.5 % Apply 1 application topically 2 (two) times daily. 30 g 0   . ciprofloxacin (CIPRO) 500 MG tablet Take 1 tablet (500 mg total) by mouth 2 (two) times daily. For 7 days (Patient not taking: Reported on 07/19/2015) 14 tablet 0 Not Taking at Unknown time  . ondansetron (ZOFRAN-ODT) 8 MG disintegrating tablet Take 1 tablet (8 mg total) by mouth every 8 (eight) hours as needed for nausea or vomiting. For daytime use (Patient not taking: Reported on 07/19/2015) 30 tablet 0 Not Taking at Unknown time   Allergies:  Allergies  Allergen Reactions   . Nystatin     History reviewed. No pertinent family history. Social History:  reports that she has been smoking Cigarettes.  She has a 7.5 pack-year smoking history. She has never used smokeless tobacco. She reports that she drinks about 0.6 oz of alcohol per week. She reports that she does not use illicit drugs.  Review of Systems  Genitourinary: Positive for flank pain.  All other systems reviewed and are negative.   Physical Exam:  Vital signs in last 24 hours: Temp:  [98.4 F (36.9 C)] 98.4 F (36.9 C) (03/29 1102) Pulse Rate:  [75] 75 (03/29 1102) Resp:  [28] 28 (03/29 1102) BP: (122)/(81) 122/81 mmHg (03/29 1102) SpO2:  [97 %] 97 % (03/29 1102) Weight:  [86.183 kg (190 lb)] 86.183 kg (190 lb) (03/29 1102) Physical Exam  Constitutional: She is oriented to person, place, and time. She appears well-developed and well-nourished.  HENT:  Head: Normocephalic and atraumatic.  Eyes: EOM are normal. Pupils are equal, round, and reactive to light.  Neck: Normal range of motion. No thyromegaly present.  GI: Soft. She exhibits no distension and no mass. There is no tenderness. There is no rebound and no guarding.  Musculoskeletal: Normal range of motion.  Neurological: She is alert and oriented to person, place, and time.  Skin: Skin is warm and dry.  Psychiatric: She has a normal mood and affect. Her behavior is normal. Judgment and thought content normal.  Laboratory Data:  No results found for this or any previous visit (from the past 24 hour(s)). No results found for this or any previous visit (from the past 240 hour(s)). Creatinine: No results for input(s): CREATININE in the last 168 hours. Baseline Creatinine: unknown  Impression/Assessment:  35yo with bilateral renal calculi  Plan:  The risks/benefits/alternatives to bilateral ureteroscopic stone extraction was explained to the patient and she understands and wishes to proceed with surgery  [name]  L 07/25/2015, 12:31 PM

## 2015-07-25 NOTE — Anesthesia Procedure Notes (Signed)
Procedure Name: LMA Insertion Date/Time: 07/25/2015 12:49 PM Performed by: Despina HiddenIDACAVAGE, Teigen Parslow J Pre-anesthesia Checklist: Patient identified, Emergency Drugs available, Suction available and Patient being monitored Patient Re-evaluated:Patient Re-evaluated prior to inductionOxygen Delivery Method: Circle system utilized Preoxygenation: Pre-oxygenation with 100% oxygen Intubation Type: IV induction Ventilation: Mask ventilation without difficulty LMA: LMA inserted LMA Size: 4.0 Grade View: Grade I Number of attempts: 1 Placement Confirmation: positive ETCO2 and breath sounds checked- equal and bilateral Tube secured with: Tape Dental Injury: Teeth and Oropharynx as per pre-operative assessment

## 2015-07-25 NOTE — Anesthesia Preprocedure Evaluation (Signed)
Anesthesia Evaluation  Patient identified by MRN, date of birth, ID band Patient awake    Reviewed: Allergy & Precautions, NPO status , Patient's Chart, lab work & pertinent test results  Airway Mallampati: I  TM Distance: >3 FB     Dental  (+) Teeth Intact, Dental Advisory Given   Pulmonary Current Smoker,    breath sounds clear to auscultation       Cardiovascular negative cardio ROS   Rhythm:Regular Rate:Normal     Neuro/Psych Anxiety    GI/Hepatic negative GI ROS,   Endo/Other    Renal/GU Renal disease (bilat stones)     Musculoskeletal   Abdominal   Peds  Hematology   Anesthesia Other Findings   Reproductive/Obstetrics                             Anesthesia Physical Anesthesia Plan  ASA: I  Anesthesia Plan: General   Post-op Pain Management:    Induction: Intravenous  Airway Management Planned: LMA  Additional Equipment:   Intra-op Plan:   Post-operative Plan: Extubation in OR  Informed Consent: I have reviewed the patients History and Physical, chart, labs and discussed the procedure including the risks, benefits and alternatives for the proposed anesthesia with the patient or authorized representative who has indicated his/her understanding and acceptance.     Plan Discussed with:   Anesthesia Plan Comments:         Anesthesia Quick Evaluation

## 2015-07-25 NOTE — Brief Op Note (Signed)
07/25/2015  1:50 PM  PATIENT:  Stacey Wilson  35 y.o. female  PRE-OPERATIVE DIAGNOSIS:  bilateral renal calculus  POST-OPERATIVE DIAGNOSIS:  bilateral renal calculus  PROCEDURE:  Procedure(s): CYSTOSCOPY WITH BILATERAL RETROGRADE PYELOGRAM, BILATERAL URETEROSCOPY AND BILATERAL STENT PLACEMENT (Bilateral) BILATERAL URETERAL STONE EXTRACTION WITH BASKET (Bilateral)  SURGEON:  Surgeon(s) and Role:    * Malen GauzePatrick L Tyjai Matuszak, MD - Primary  PHYSICIAN ASSISTANT:   ASSISTANTS: none   ANESTHESIA:   general  EBL:  Total I/O In: 500 [I.V.:500] Out: -   BLOOD ADMINISTERED:none  DRAINS: bilateral 6x26 JJ ureteral stents with tether  LOCAL MEDICATIONS USED:  NONE  SPECIMEN:  Source of Specimen:  bilateral renal calculi  DISPOSITION OF SPECIMEN:  N/A  COUNTS:  YES  TOURNIQUET:  * No tourniquets in log *  DICTATION: .Note written in EPIC  PLAN OF CARE: Discharge to home after PACU  PATIENT DISPOSITION:  PACU - hemodynamically stable.   Delay start of Pharmacological VTE agent (>24hrs) due to surgical blood loss or risk of bleeding: not applicable

## 2015-07-27 ENCOUNTER — Ambulatory Visit (INDEPENDENT_AMBULATORY_CARE_PROVIDER_SITE_OTHER): Payer: PRIVATE HEALTH INSURANCE | Admitting: Urology

## 2015-07-27 ENCOUNTER — Encounter (HOSPITAL_COMMUNITY): Payer: Self-pay | Admitting: Urology

## 2015-07-27 DIAGNOSIS — N200 Calculus of kidney: Secondary | ICD-10-CM | POA: Diagnosis not present

## 2015-07-27 NOTE — Op Note (Signed)
Marland Kitchen.Preoperative diagnosis: bilateral renal calculi  Postoperative diagnosis: Same  Procedure: 1 cystoscopy 2. Bilateral retrograde pyelography 3.  Intraoperative fluoroscopy, under one hour, with interpretation 4.  Bilateral ureteroscopic stone manipulation with basket extraction 5.  bilateral 6 x 26 JJ stent placement  Attending: Cleda MccreedyPatrick Mackenzie  Anesthesia: General  Estimated blood loss: None  Drains: bilateral 6 x 26 JJ ureteral stent with tether  Specimens: stone for analysis  Antibiotics: ancef  Findings: bilateral lower pole renal calculi. no hydronephrosis. No masses/lesions in the bladder. Ureteral orifices in normal anatomic location.  Indications: Patient is a 35 year old female with a history of bilateral renal calculi and bilateral flank pain. After discussing treatment options, they decided proceed with bilateral ureteroscopic stone manipulation.  Procedure her in detail: The patient was brought to the operating room and a brief timeout was done to ensure correct patient, correct procedure, correct site.  General anesthesia was administered patient was placed in dorsal lithotomy position.  Her genitalia was then prepped and draped in usual sterile fashion.  A rigid 22 French cystoscope was passed in the urethra and the bladder.  Bladder was inspected free masses or lesions.  the ureteral orifices were in the normal orthotopic locations. a 6 french ureteral catheter was then instilled into the left ureteral orifice.  a gentle retrograde was obtained and findings noted above. We then advanced a zipwire through the catheter and up to the renal pelvis.  we then removed the cystoscope and cannulated the left ureteral orifice with a semirigid ureteroscope.  We advanced up to the renbal pelvis and no stone or tumor was encountered. We then advanced a sensor wire into the renal pelvis. We removed the scope and advanced a 12/14 x 38cm access sheath up to the renal pelvis. We then used  the flexible ureteroscope to perform nephroscopy. We located multiple calculi in the lower pole which were removed with an NGage basket. Once the stones were removed we removed the access sheath under direct vision and noted no injury to the ureter. We then placed a 6 x 26 double-j ureteral stent over the original zip wire. We then removed the wire and good coil was noted in the the renal pelvis under fluoroscopy and the bladder under direct vision.  We then turned out attention to the right side.  a gentle retrograde was obtained and findings noted above. We then advanced a zipwire through the catheter and up to the renal pelvis.  we then removed the cystoscope and cannulated the left ureteral orifice with a semirigid ureteroscope.  We advanced up to the renbal pelvis and no stone or tumor was encountered. We then advanced a sensor wire into the renal pelvis. We removed the scope and advanced a 12/14 x 38cm access sheath up to the renal pelvis. We then used the flexible ureteroscope to perform nephroscopy. We located multiple calculi in the lower pole which were removed with an NGage basket. Once the stones were removed we removed the access sheath under direct vision and noted no injury to the ureter. We then placed a 6 x 26 double-j ureteral stent over the original zip wire. We then removed the wire and good coil was noted in the the renal pelvis under fluoroscopy and the bladder under direct vision.  the bladder was then drained and this concluded the procedure which was well tolerated by patient.  Complications: None  Condition: Stable, extubated, transferred to PACU  Plan: Patient is to be discharged home as to follow-up in 2  weeks. She is to remove her stent by pulling the tehter in 72 hours

## 2015-07-31 LAB — STONE ANALYSIS
Ca Oxalate,Dihydrate: 10 %
Ca Oxalate,Monohydr.: 70 %
Ca phos cry stone ql IR: 20 %
Stone Weight KSTONE: 104 mg

## 2015-08-08 ENCOUNTER — Ambulatory Visit: Payer: PRIVATE HEALTH INSURANCE | Admitting: Urology

## 2015-08-22 ENCOUNTER — Ambulatory Visit (INDEPENDENT_AMBULATORY_CARE_PROVIDER_SITE_OTHER): Payer: PRIVATE HEALTH INSURANCE | Admitting: Urology

## 2015-08-22 DIAGNOSIS — N200 Calculus of kidney: Secondary | ICD-10-CM | POA: Diagnosis not present

## 2015-10-03 ENCOUNTER — Ambulatory Visit: Payer: PRIVATE HEALTH INSURANCE | Admitting: Adult Health

## 2016-01-02 ENCOUNTER — Encounter: Payer: Self-pay | Admitting: Family Medicine

## 2016-01-25 ENCOUNTER — Other Ambulatory Visit: Payer: Self-pay | Admitting: Urology

## 2016-01-25 DIAGNOSIS — N200 Calculus of kidney: Secondary | ICD-10-CM

## 2016-02-07 ENCOUNTER — Encounter: Payer: Self-pay | Admitting: Family Medicine

## 2016-02-11 ENCOUNTER — Ambulatory Visit (HOSPITAL_COMMUNITY)
Admission: RE | Admit: 2016-02-11 | Discharge: 2016-02-11 | Disposition: A | Discharge status: Home / Self Care | Payer: PRIVATE HEALTH INSURANCE | Source: Ambulatory Visit | Attending: Urology | Admitting: Urology

## 2016-02-11 DIAGNOSIS — N200 Calculus of kidney: Secondary | ICD-10-CM | POA: Diagnosis not present

## 2016-04-25 ENCOUNTER — Telehealth: Payer: Self-pay | Admitting: Family Medicine

## 2016-04-25 NOTE — Telephone Encounter (Signed)
Patients kidney stones were bothering her 04/23/17 and 04/24/16.  She is requesting a work excuse for these days.  Also, she wanted to let Dr. Lorin PicketScott know that her appt with urologist is 05/07/16.

## 2016-04-25 NOTE — Telephone Encounter (Signed)
It would be finding give her a work note for those days. If she needs [place] do anything before she sees the urologist then she should let us know-otherwise keep appointment with urology

## 2016-04-29 ENCOUNTER — Encounter: Payer: Self-pay | Admitting: Family Medicine

## 2016-04-29 NOTE — Telephone Encounter (Signed)
Note done and faxed.

## 2016-05-07 ENCOUNTER — Ambulatory Visit (INDEPENDENT_AMBULATORY_CARE_PROVIDER_SITE_OTHER): Payer: PRIVATE HEALTH INSURANCE | Admitting: Urology

## 2016-05-07 DIAGNOSIS — N200 Calculus of kidney: Secondary | ICD-10-CM | POA: Diagnosis not present

## 2016-05-09 ENCOUNTER — Other Ambulatory Visit: Payer: Self-pay | Admitting: Radiology

## 2016-05-09 DIAGNOSIS — N200 Calculus of kidney: Secondary | ICD-10-CM

## 2016-05-12 ENCOUNTER — Encounter (HOSPITAL_COMMUNITY): Payer: Self-pay

## 2016-05-12 ENCOUNTER — Encounter (HOSPITAL_COMMUNITY)
Admission: RE | Admit: 2016-05-12 | Discharge: 2016-05-12 | Disposition: A | Discharge status: Home / Self Care | Payer: PRIVATE HEALTH INSURANCE | Source: Ambulatory Visit | Attending: Urology | Admitting: Urology

## 2016-05-12 DIAGNOSIS — N200 Calculus of kidney: Secondary | ICD-10-CM | POA: Insufficient documentation

## 2016-05-12 DIAGNOSIS — Z01812 Encounter for preprocedural laboratory examination: Secondary | ICD-10-CM | POA: Insufficient documentation

## 2016-05-12 LAB — BASIC METABOLIC PANEL
ANION GAP: 5 (ref 5–15)
BUN: 14 mg/dL (ref 6–20)
CALCIUM: 9.8 mg/dL (ref 8.9–10.3)
CO2: 26 mmol/L (ref 22–32)
Chloride: 102 mmol/L (ref 101–111)
Creatinine, Ser: 0.64 mg/dL (ref 0.44–1.00)
GFR calc Af Amer: >60 mL/min (ref >60)
Glucose, Bld: 98 mg/dL (ref 65–99)
POTASSIUM: 3.8 mmol/L (ref 3.5–5.1)
SODIUM: 133 mmol/L — AB (ref 135–145)

## 2016-05-12 LAB — CBC
HCT: 37.7 % (ref 36.0–46.0)
Hemoglobin: 12.9 g/dL (ref 12.0–15.0)
MCH: 29.9 pg (ref 26.0–34.0)
MCHC: 34.2 g/dL (ref 30.0–36.0)
MCV: 87.5 fL (ref 78.0–100.0)
Platelets: 262 10*3/uL (ref 150–400)
RBC: 4.31 MIL/uL (ref 3.87–5.11)
RDW: 12.1 % (ref 11.5–15.5)
WBC: 9.2 10*3/uL (ref 4.0–10.5)

## 2016-05-12 LAB — HCG, SERUM, QUALITATIVE: PREG SERUM: NEGATIVE

## 2016-05-12 NOTE — Patient Instructions (Signed)
Stacey Wilson  05/12/2016     @PREFPERIOPPHARMACY @   Your procedure is scheduled on  05/14/2016  Report to [facility] at  730  A.M.  Call this number if you have problems the morning of surgery:  [phone]   Remember:  Do not eat food or drink liquids after midnight.  Take these medicines the morning of surgery with A SIP OF WATER  Norco, phenergan, flomax.   Do not wear jewelry, make-up or nail polish.  Do not wear lotions, powders, or perfumes, or deoderant.  Do not shave 48 hours prior to surgery.  Men may shave face and neck.  Do not bring valuables to the hospital.  [facility] is not responsible for any belongings or valuables.  Contacts, dentures or bridgework may not be worn into surgery.  Leave your suitcase in the car.  After surgery it may be brought to your room.  For patients admitted to the hospital, discharge time will be determined by your treatment team.  Patients discharged the day of surgery will not be allowed to drive home.   Name and phone number of your driver:   family Special instructions:  None  Please read over the following fact sheets that you were given. Anesthesia Post-op Instructions and Care and Recovery After Surgery       Cystoscopy Cystoscopy is a procedure that is used to help diagnose and sometimes treat conditions that affect that lower urinary tract. The lower urinary tract includes the bladder and the tube that drains urine from the bladder out of the body (urethra). Cystoscopy is performed with a thin, tube-shaped instrument with a light and camera at the end (cystoscope). The cystoscope may be hard (rigid) or flexible, depending on the goal of the procedure.The cystoscope is inserted through the urethra, into the bladder. Cystoscopy may be recommended if you have:  Urinary tractinfections that keep coming back (recurring).  Blood in the urine (hematuria).  Loss of bladder control (urinary incontinence) or  an overactive bladder.  Unusual cells found in a urine sample.  A blockage in the urethra.  Painful urination.  An abnormality in the bladder found during an intravenous pyelogram (IVP) or CT scan. Cystoscopy may also be done to remove a sample of tissue to be examined under a microscope (biopsy). Tell a health care provider about:  Any allergies you have.  All medicines you are taking, including vitamins, herbs, eye drops, creams, and over-the-counter medicines.  Any problems you or family members have had with anesthetic medicines.  Any blood disorders you have.  Any surgeries you have had.  Any medical conditions you have.  Whether you are pregnant or may be pregnant. What are the risks? Generally, this is a safe procedure. However, problems may occur, including:  Infection.  Bleeding.  Allergic reactions to medicines.  Damage to other structures or organs. What happens before the procedure?  Ask your health care provider about:  Changing or stopping your regular medicines. This is especially important if you are taking diabetes medicines or blood thinners.  Taking medicines such as aspirin and ibuprofen. These medicines can thin your blood. Do not take these medicines before your procedure if your health care provider instructs you not to.  Follow instructions from your health care provider about eating or drinking restrictions.  You may be given antibiotic medicine to help prevent infection.  You may have an exam or testing, such as X-rays  of the bladder, urethra, or kidneys.  You may have urine tests to check for signs of infection.  Plan to have someone take you home after the procedure. What happens during the procedure?  To reduce your risk of infection,your health care team will wash or sanitize their hands.  You will be given one or more of the following:  A medicine to help you relax (sedative).  A medicine to numb the area (local  anesthetic).  The area around the opening of your urethra will be cleaned.  The cystoscope will be passed through your urethra into your bladder.  Germ-free (sterile)fluid will flow through the cystoscope to fill your bladder. The fluid will stretch your bladder so that your surgeon can clearly examine your bladder walls.  The cystoscope will be removed and your bladder will be emptied. The procedure may vary among health care providers and hospitals. What happens after the procedure?  You may have some soreness or pain in your abdomen and urethra. Medicines will be available to help you.  You may have some blood in your urine.  Do not drive for 24 hours if you received a sedative. This information is not intended to replace advice given to you by your health care provider. Make sure you discuss any questions you have with your health care provider. Document Released: 04/11/2000 Document Revised: 08/23/2015 Document Reviewed: 03/01/2015 Elsevier Interactive Patient Education  2017 [employer]  Cystoscopy, Care After Refer to this sheet in the next few weeks. These instructions provide you with information about caring for yourself after your procedure. Your health care provider may also give you more specific instructions. Your treatment has been planned according to current medical practices, but problems sometimes occur. Call your health care provider if you have any problems or questions after your procedure. What can I expect after the procedure? After the procedure, it is common to have:  Mild pain when you urinate. Pain should stop within a few minutes after you urinate. This may last for up to 1 week.  A small amount of blood in your urine for several days.  Feeling like you need to urinate but producing only a small amount of urine. Follow these instructions at home:   Medicines  Take over-the-counter and prescription medicines only as told by your health care  provider.  If you were prescribed an antibiotic medicine, take it as told by your health care provider. Do not stop taking the antibiotic even if you start to feel better. General instructions  Return to your normal activities as told by your health care provider. Ask your health care provider what activities are safe for you.  Do not drive for 24 hours if you received a sedative.  Watch for any blood in your urine. If the amount of blood in your urine increases, call your health care provider.  Follow instructions from your health care provider about eating or drinking restrictions.  If a tissue sample was removed for testing (biopsy) during your procedure, it is your responsibility to get your test results. Ask your health care provider or the department performing the test when your results will be ready.  Drink enough fluid to keep your urine clear or pale yellow.  Keep all follow-up visits as told by your health care provider. This is important. Contact a health care provider if:  You have pain that gets worse or does not get better with medicine, especially pain when you urinate.  You have difficulty urinating. Get  help right away if:  You have more blood in your urine.  You have blood clots in your urine.  You have abdominal pain.  You have a fever or chills.  You are unable to urinate. This information is not intended to replace advice given to you by your health care provider. Make sure you discuss any questions you have with your health care provider. Document Released: 11/01/2004 Document Revised: 09/20/2015 Document Reviewed: 03/01/2015 Elsevier Interactive Patient Education  2017 [employer]  Ureteral Stent Implantation Introduction Ureteral stent implantation is a procedure to insert (implant) a flexible, soft, plastic tube (stent) into a tube (ureter) that drains urine from the kidneys. The stent supports the ureter while it heals and helps to drain urine  from the kidneys. You may have a ureteral stent implanted after having a procedure to remove a blockage from the ureter (ureterolysis or pyeloplasty).You may also have a stent implanted to open the flow of urine when you have a blockage caused by a kidney stone, tumor, blood clot, or infection. You have two ureters, one on each side of the body. The ureters connect the kidneys to the organ that holds urine until it passes out of the body (bladder). The stent is placed so that one end is in the kidney, and one end is in the bladder. The stent is usually taken out after your ureter has healed. Depending on your condition, you may have a stent for just a few weeks, or you may have a long-term stent that will need to be replaced every few months. Tell a health care provider about:  Any allergies you have.  All medicines you are taking, including vitamins, herbs, eye drops, creams, and over-the-counter medicines.  Any problems you or family members have had with anesthetic medicines.  Any blood disorders you have.  Any surgeries you have had.  Any medical conditions you have.  Whether you are pregnant or may be pregnant. What are the risks? Generally, this is a safe procedure. However, problems may occur, including:  Infection.  Bleeding.  Allergic reactions to medicines.  Damage to other structures or organs. Tearing (perforation) of the ureter is possible.  Movement of the stent away from where it is placed during surgery (migration). What happens before the procedure?  Ask your health care provider about:  Changing or stopping your regular medicines. This is especially important if you are taking diabetes medicines or blood thinners.  Taking medicines such as aspirin and ibuprofen. These medicines can thin your blood. Do not take these medicines before your procedure if your health care provider instructs you not to.  Follow instructions from your health care provider about eating  or drinking restrictions.  Do not drink alcohol and do not use any tobacco products before your procedure, as told by your health care provider.  You may be given antibiotic medicine to help prevent infection.  Plan to have someone take you home after the procedure.  If you go home right after the procedure, plan to have someone with you for 24 hours. What happens during the procedure?  An IV tube will be inserted into one of your veins.  You will be given a medicine to make you fall asleep (general anesthetic). You may also be given a medicine to help you relax (sedative).  A thin, tube-shaped instrument with a light and tiny camera at the end (cystoscope) will be inserted into your urethra. The urethra is the tube that drains urine from the bladder  out of the body. In men, the urethra opens at the end of the penis. In women, the urethra opens in front of the vaginal opening.  The cystoscope will be passed into your bladder.  A thin wire (guide wire) will be passed through your bladder and into your ureter. This is used to guide the stent into your ureter.  The stent will be inserted into your ureter.  The guide wire and the cystoscope will be removed.  A flexible tube (catheter) will be inserted through your urethra so that one end is in your bladder. This helps to drain urine from your bladder. The procedure may vary among hospitals and health care providers. What happens after the procedure?  Your blood pressure, heart rate, breathing rate, and blood oxygen level will be monitored often until the medicines you were given have worn off.  You may continue to receive medicine and fluids through an IV tube.  You may have some soreness or pain in your abdomen and urethra. Medicines will be available to help you.  You will be encouraged to get up and walk around as soon as you can.  You will have a catheter draining your urine.  You will have some blood in your urine.  Do not  drive for 24 hours if you received a sedative. This information is not intended to replace advice given to you by your health care provider. Make sure you discuss any questions you have with your health care provider. Document Released: 04/11/2000 Document Revised: 09/20/2015 Document Reviewed: 10/27/2014  2017 Elsevier  Ureteral Stent Implantation, Care After Introduction Refer to this sheet in the next few weeks. These instructions provide you with information about caring for yourself after your procedure. Your health care provider may also give you more specific instructions. Your treatment has been planned according to current medical practices, but problems sometimes occur. Call your health care provider if you have any problems or questions after your procedure. What can I expect after the procedure? After the procedure, it is common to have:  Nausea.  Mild pain when you urinate. You may feel this pain in your lower back or lower abdomen. Pain should stop within a few minutes after you urinate. This may last for up to 1 week.  A small amount of blood in your urine for several days. Follow these instructions at home:   Medicines  Take over-the-counter and prescription medicines only as told by your health care provider.  If you were prescribed an antibiotic medicine, take it as told by your health care provider. Do not stop taking the antibiotic even if you start to feel better.  Do not drive for 24 hours if you received a sedative.  Do not drive or operate heavy machinery while taking prescription pain medicines. Activity  Return to your normal activities as told by your health care provider. Ask your health care provider what activities are safe for you.  Do not lift anything that is heavier than 10 lb (4.5 kg). Follow this limit for 1 week after your procedure, or for as long as told by your health care provider. General instructions  Watch for any blood in your urine.  Call your health care provider if the amount of blood in your urine increases.  If you have a catheter:  Follow instructions from your health care provider about taking care of your catheter and collection bag.  Do not take baths, swim, or use a hot tub until your health care  provider approves.  Drink enough fluid to keep your urine clear or pale yellow.  Keep all follow-up visits as told by your health care provider. This is important. Contact a health care provider if:  You have pain that gets worse or does not get better with medicine, especially pain when you urinate.  You have difficulty urinating.  You feel nauseous or you vomit repeatedly during a period of more than 2 days after the procedure. Get help right away if:  Your urine is dark red or has blood clots in it.  You are leaking urine (have incontinence).  The end of the stent comes out of your urethra.  You cannot urinate.  You have sudden, sharp, or severe pain in your abdomen or lower back.  You have a fever. This information is not intended to replace advice given to you by your health care provider. Make sure you discuss any questions you have with your health care provider. Document Released: 12/15/2012 Document Revised: 09/20/2015 Document Reviewed: 10/27/2014  2017 Elsevier PATIENT INSTRUCTIONS POST-ANESTHESIA  IMMEDIATELY FOLLOWING SURGERY:  Do not drive or operate machinery for the first twenty four hours after surgery.  Do not make any important decisions for twenty four hours after surgery or while taking narcotic pain medications or sedatives.  If you develop intractable nausea and vomiting or a severe headache please notify your doctor immediately.  FOLLOW-UP:  Please make an appointment with your surgeon as instructed. You do not need to follow up with anesthesia unless specifically instructed to do so.  WOUND CARE INSTRUCTIONS (if applicable):  Keep a dry clean dressing on the anesthesia/puncture  wound site if there is drainage.  Once the wound has quit draining you may leave it open to air.  Generally you should leave the bandage intact for twenty four hours unless there is drainage.  If the epidural site drains for more than 36-48 hours please call the anesthesia department.  QUESTIONS?:  Please feel free to call your physician or the hospital operator if you have any questions, and they will be happy to assist you.

## 2016-05-13 ENCOUNTER — Encounter (HOSPITAL_COMMUNITY): Payer: Self-pay | Admitting: Anesthesiology

## 2016-05-14 ENCOUNTER — Ambulatory Visit (HOSPITAL_COMMUNITY): Admission: RE | Admit: 2016-05-14 | Payer: PRIVATE HEALTH INSURANCE | Source: Ambulatory Visit | Admitting: Urology

## 2016-05-14 ENCOUNTER — Encounter (HOSPITAL_COMMUNITY): Admission: RE | Payer: Self-pay | Source: Ambulatory Visit

## 2016-05-14 SURGERY — CYSTOSCOPY, WITH CALCULUS REMOVAL USING BASKET
Anesthesia: General | Laterality: Bilateral

## 2016-05-16 ENCOUNTER — Other Ambulatory Visit: Payer: Self-pay | Admitting: Urology

## 2016-05-16 ENCOUNTER — Encounter (HOSPITAL_BASED_OUTPATIENT_CLINIC_OR_DEPARTMENT_OTHER): Payer: Self-pay | Admitting: *Deleted

## 2016-05-16 NOTE — Progress Notes (Signed)
NPO AFTER MN.  ARRIVE AT 1030.  CURRENT LAB RESULTS IN CHART AND EPIC (CBC, BMET, NEGATIVE PREG. SERUM).  MAY TAKE PAIN/ NAUSEA RX IF NEEDED AM DOS W/ SIPS OF WATER.

## 2016-05-19 ENCOUNTER — Ambulatory Visit (HOSPITAL_BASED_OUTPATIENT_CLINIC_OR_DEPARTMENT_OTHER): Payer: PRIVATE HEALTH INSURANCE | Admitting: Anesthesiology

## 2016-05-19 ENCOUNTER — Encounter (HOSPITAL_BASED_OUTPATIENT_CLINIC_OR_DEPARTMENT_OTHER): Payer: Self-pay | Admitting: *Deleted

## 2016-05-19 ENCOUNTER — Ambulatory Visit (HOSPITAL_BASED_OUTPATIENT_CLINIC_OR_DEPARTMENT_OTHER)
Admission: RE | Admit: 2016-05-19 | Discharge: 2016-05-19 | Disposition: A | Discharge status: Home / Self Care | Payer: PRIVATE HEALTH INSURANCE | Source: Ambulatory Visit | Attending: Urology | Admitting: Urology

## 2016-05-19 ENCOUNTER — Encounter (HOSPITAL_BASED_OUTPATIENT_CLINIC_OR_DEPARTMENT_OTHER)
Admission: RE | Disposition: A | Discharge status: Home / Self Care | Payer: Self-pay | Source: Ambulatory Visit | Attending: Urology

## 2016-05-19 DIAGNOSIS — F17210 Nicotine dependence, cigarettes, uncomplicated: Secondary | ICD-10-CM | POA: Insufficient documentation

## 2016-05-19 DIAGNOSIS — N200 Calculus of kidney: Secondary | ICD-10-CM | POA: Insufficient documentation

## 2016-05-19 DIAGNOSIS — Z793 Long term (current) use of hormonal contraceptives: Secondary | ICD-10-CM | POA: Insufficient documentation

## 2016-05-19 HISTORY — DX: Urgency of urination: R39.15

## 2016-05-19 HISTORY — PX: CYSTOSCOPY W/ URETERAL STENT PLACEMENT: SHX1429

## 2016-05-19 HISTORY — DX: Calculus of kidney: N20.0

## 2016-05-19 HISTORY — DX: Personal history of urinary calculi: Z87.442

## 2016-05-19 SURGERY — CYSTOSCOPY, WITH RETROGRADE PYELOGRAM AND URETERAL STENT INSERTION
Anesthesia: General | Site: Renal | Laterality: Bilateral

## 2016-05-19 MED ORDER — OXYCODONE HCL 5 MG PO TABS
5.0000 mg | ORAL_TABLET | Freq: Once | ORAL | Status: AC | PRN
  Administered 2016-05-19: 5 mg via ORAL
  Filled 2016-05-19: qty 1

## 2016-05-19 MED ORDER — PROPOFOL 10 MG/ML IV BOLUS
INTRAVENOUS | Status: AC
  Filled 2016-05-19: qty 20

## 2016-05-19 MED ORDER — OXYCODONE HCL 5 MG/5ML PO SOLN
5.0000 mg | Freq: Once | ORAL | Status: AC | PRN
  Filled 2016-05-19: qty 5

## 2016-05-19 MED ORDER — OXYCODONE HCL 5 MG PO TABS
ORAL_TABLET | ORAL | Status: AC
  Filled 2016-05-19: qty 1

## 2016-05-19 MED ORDER — HYDROCODONE-ACETAMINOPHEN 5-325 MG PO TABS: 1.0000 | ORAL_TABLET | Freq: Four times a day (QID) | ORAL | 0 refills | Status: DC | PRN

## 2016-05-19 MED ORDER — FENTANYL CITRATE (PF) 100 MCG/2ML IJ SOLN
INTRAMUSCULAR | Status: AC
  Filled 2016-05-19: qty 2

## 2016-05-19 MED ORDER — CEFAZOLIN SODIUM-DEXTROSE 2-4 GM/100ML-% IV SOLN
INTRAVENOUS | Status: AC
  Filled 2016-05-19: qty 100

## 2016-05-19 MED ORDER — KETOROLAC TROMETHAMINE 30 MG/ML IJ SOLN
INTRAMUSCULAR | Status: DC | PRN
  Administered 2016-05-19: 30 mg via INTRAVENOUS

## 2016-05-19 MED ORDER — LIDOCAINE 2% (20 MG/ML) 5 ML SYRINGE
INTRAMUSCULAR | Status: DC | PRN
  Administered 2016-05-19: 100 mg via INTRAVENOUS

## 2016-05-19 MED ORDER — CEFAZOLIN IN D5W 1 GM/50ML IV SOLN
1.0000 g | INTRAVENOUS | Status: DC
  Filled 2016-05-19: qty 50

## 2016-05-19 MED ORDER — ONDANSETRON HCL 4 MG/2ML IJ SOLN
INTRAMUSCULAR | Status: DC | PRN
  Administered 2016-05-19: 4 mg via INTRAVENOUS

## 2016-05-19 MED ORDER — LIDOCAINE 2% (20 MG/ML) 5 ML SYRINGE
INTRAMUSCULAR | Status: AC
  Filled 2016-05-19: qty 5

## 2016-05-19 MED ORDER — MIDAZOLAM HCL 5 MG/5ML IJ SOLN
INTRAMUSCULAR | Status: DC | PRN
  Administered 2016-05-19: 2 mg via INTRAVENOUS

## 2016-05-19 MED ORDER — FENTANYL CITRATE (PF) 100 MCG/2ML IJ SOLN
25.0000 ug | INTRAMUSCULAR | Status: DC | PRN
  Filled 2016-05-19: qty 1

## 2016-05-19 MED ORDER — ONDANSETRON HCL 4 MG/2ML IJ SOLN
4.0000 mg | Freq: Four times a day (QID) | INTRAMUSCULAR | Status: DC | PRN
  Filled 2016-05-19: qty 2

## 2016-05-19 MED ORDER — MIDAZOLAM HCL 2 MG/2ML IJ SOLN
INTRAMUSCULAR | Status: AC
  Filled 2016-05-19: qty 2

## 2016-05-19 MED ORDER — SULFAMETHOXAZOLE-TRIMETHOPRIM 800-160 MG PO TABS: 1.0000 | ORAL_TABLET | Freq: Two times a day (BID) | ORAL | 0 refills | Status: DC

## 2016-05-19 MED ORDER — LACTATED RINGERS IV SOLN
INTRAVENOUS | Status: DC
  Administered 2016-05-19: 11:00:00 via INTRAVENOUS
  Filled 2016-05-19: qty 1000

## 2016-05-19 MED ORDER — CEFAZOLIN SODIUM-DEXTROSE 2-4 GM/100ML-% IV SOLN
2.0000 g | INTRAVENOUS | Status: AC
  Administered 2016-05-19: 2 g via INTRAVENOUS
  Filled 2016-05-19: qty 100

## 2016-05-19 MED ORDER — DEXAMETHASONE SODIUM PHOSPHATE 10 MG/ML IJ SOLN
INTRAMUSCULAR | Status: AC
  Filled 2016-05-19: qty 1

## 2016-05-19 MED ORDER — IOHEXOL 300 MG/ML  SOLN
INTRAMUSCULAR | Status: DC | PRN
  Administered 2016-05-19: 20 mL via URETHRAL

## 2016-05-19 MED ORDER — FENTANYL CITRATE (PF) 100 MCG/2ML IJ SOLN
INTRAMUSCULAR | Status: DC | PRN
  Administered 2016-05-19 (×4): 50 ug via INTRAVENOUS

## 2016-05-19 MED ORDER — DEXAMETHASONE SODIUM PHOSPHATE 4 MG/ML IJ SOLN
INTRAMUSCULAR | Status: DC | PRN
  Administered 2016-05-19: 10 mg via INTRAVENOUS

## 2016-05-19 MED ORDER — KETOROLAC TROMETHAMINE 30 MG/ML IJ SOLN
INTRAMUSCULAR | Status: AC
  Filled 2016-05-19: qty 1

## 2016-05-19 MED ORDER — PROPOFOL 10 MG/ML IV BOLUS
INTRAVENOUS | Status: DC | PRN
  Administered 2016-05-19: 200 mg via INTRAVENOUS

## 2016-05-19 MED ORDER — ONDANSETRON HCL 4 MG/2ML IJ SOLN
INTRAMUSCULAR | Status: AC
  Filled 2016-05-19: qty 2

## 2016-05-19 MED ORDER — SODIUM CHLORIDE 0.9 % IR SOLN
Status: DC | PRN
  Administered 2016-05-19: 3000 mL via INTRAVESICAL

## 2016-05-19 SURGICAL SUPPLY — 15 items
BAG DRAIN URO-CYSTO SKYTR STRL (DRAIN) ×2 IMPLANT
BASKET STONE 1.7 NGAGE (UROLOGICAL SUPPLIES) ×2 IMPLANT
CATH INTERMIT  6FR 70CM (CATHETERS) ×2 IMPLANT
CLOTH BEACON ORANGE TIMEOUT ST (SAFETY) ×2 IMPLANT
FIBER LASER TRAC TIP (UROLOGICAL SUPPLIES) IMPLANT
GLOVE BIO SURGEON STRL SZ8 (GLOVE) ×2 IMPLANT
GUIDEWIRE ANG ZIPWIRE 038X150 (WIRE) ×4 IMPLANT
GUIDEWIRE STR DUAL SENSOR (WIRE) ×2 IMPLANT
IV NS IRRIG 3000ML ARTHROMATIC (IV SOLUTION) ×2 IMPLANT
KIT ROOM TURNOVER WOR (KITS) ×2 IMPLANT
MANIFOLD NEPTUNE II (INSTRUMENTS) ×2 IMPLANT
PACK CYSTO (CUSTOM PROCEDURE TRAY) ×2 IMPLANT
SHEATH ACCESS URETERAL 38CM (SHEATH) ×2 IMPLANT
STENT URET 6FRX24 CONTOUR (STENTS) ×4 IMPLANT
SYRINGE 10CC LL (SYRINGE) ×2 IMPLANT

## 2016-05-19 NOTE — Discharge Instructions (Signed)

## 2016-05-19 NOTE — Transfer of Care (Signed)
Immediate Anesthesia Transfer of Care Note  Patient: Stacey Wilson  Procedure(s) Performed: Procedure(s): CYSTOSCOPY WITH RETROGRADE PYELOGRAM/URETEROSCOPY/STONE EXTRACTION WITH BASKET / LASER LITHOTRIPSY/ STENT PLACEMENT (Bilateral)  Patient Location: PACU  Anesthesia Type:General  Level of Consciousness: awake, alert  and oriented  Airway & Oxygen Therapy: Patient Spontanous Breathing and Patient connected to nasal cannula oxygen  Post-op Assessment: Report given to RN  Post vital signs: Reviewed and stable  Last Vitals: 124/86, 97, 23, 98% Vitals:   05/19/16 1037  BP: 125/67  Pulse: 88  Resp: 16  Temp: 36.6 C    Last Pain:  Vitals:   05/19/16 1037  TempSrc: Oral      Patients Stated Pain Goal: 5 (05/19/16 1047)  Complications: No apparent anesthesia complications

## 2016-05-19 NOTE — Anesthesia Preprocedure Evaluation (Signed)
Anesthesia Evaluation  Patient identified by MRN, date of birth, ID band Patient awake    Reviewed: Allergy & Precautions, H&P , NPO status , Patient's Chart, lab work & pertinent test results  Airway Mallampati: II   Neck ROM: full    Dental   Pulmonary Current Smoker,    breath sounds clear to auscultation       Cardiovascular negative cardio ROS   Rhythm:regular Rate:Normal     Neuro/Psych    GI/Hepatic   Endo/Other    Renal/GU stones     Musculoskeletal   Abdominal   Peds  Hematology   Anesthesia Other Findings   Reproductive/Obstetrics                             Anesthesia Physical Anesthesia Plan  ASA: II  Anesthesia Plan: General   Post-op Pain Management:    Induction: Intravenous  Airway Management Planned: LMA  Additional Equipment:   Intra-op Plan:   Post-operative Plan:   Informed Consent: I have reviewed the patients History and Physical, chart, labs and discussed the procedure including the risks, benefits and alternatives for the proposed anesthesia with the patient or authorized representative who has indicated his/her understanding and acceptance.     Plan Discussed with: CRNA, Anesthesiologist and Surgeon  Anesthesia Plan Comments:         Anesthesia Quick Evaluation

## 2016-05-19 NOTE — Anesthesia Postprocedure Evaluation (Addendum)
Anesthesia Post Note  Patient: Stacey Wilson  Procedure(s) Performed: Procedure(s) (LRB): CYSTOSCOPY WITH RETROGRADE PYELOGRAM/URETEROSCOPY/STONE EXTRACTION WITH BASKET / STENT PLACEMENT (Bilateral)  Patient location during evaluation: PACU Anesthesia Type: General Level of consciousness: sedated Pain management: pain level controlled Vital Signs Assessment: post-procedure vital signs reviewed and stable Respiratory status: spontaneous breathing and respiratory function stable Cardiovascular status: stable Anesthetic complications: no       Last Vitals:  Vitals:   05/19/16 1445 05/19/16 1500  BP: 114/71 114/65  Pulse: 88 74  Resp: 18 15  Temp:      Last Pain:  Vitals:   05/19/16 1500  TempSrc:   PainSc: 7                  Sade Mehlhoff DANIEL

## 2016-05-19 NOTE — Brief Op Note (Signed)
05/19/2016  2:41 PM  PATIENT:  Stacey Wilson  36 y.o. female  PRE-OPERATIVE DIAGNOSIS:  BILATERAL RENAL CALCULI  POST-OPERATIVE DIAGNOSIS:  BILATERAL RENAL CALCULI  PROCEDURE:  Procedure(s): CYSTOSCOPY WITH RETROGRADE PYELOGRAM/URETEROSCOPY/STONE EXTRACTION WITH BASKET / LASER LITHOTRIPSY/ STENT PLACEMENT (Bilateral)  SURGEON:  Surgeon(s) and Role:    * Malen GauzePatrick L Tytus Strahle, MD - Primary  PHYSICIAN ASSISTANT:   ASSISTANTS: none   ANESTHESIA:   general  EBL:  Total I/O In: 800 [I.V.:800] Out: 10 [Blood:10]  BLOOD ADMINISTERED:none  DRAINS: bilateral 6x24 JJ ureteral stent   LOCAL MEDICATIONS USED:  NONE  SPECIMEN:  Source of Specimen:  bilateral renal calculi  DISPOSITION OF SPECIMEN:  N/A  COUNTS:  YES  TOURNIQUET:  * No tourniquets in log *  DICTATION: .Note written in EPIC  PLAN OF CARE: Discharge to home after PACU  PATIENT DISPOSITION:  PACU - hemodynamically stable.   Delay start of Pharmacological VTE agent (>24hrs) due to surgical blood loss or risk of bleeding: not applicable

## 2016-05-19 NOTE — H&P (Signed)
Urology Admission H&P  Chief Complaint: bilateral falnk pain  History of Present Illness: [name] is a 36yo with bilateral renal calculi and persistent bilateral flank pain  Past Medical History:  Diagnosis Date  . Anxiety   . History of kidney stones   . Renal calculi    bilateral  . Urgency of urination   . Warts, genital    Past Surgical History:  Procedure Laterality Date  . BREAST SURGERY Right 2002   benign cyst  . CYSTOSCOPY WITH RETROGRADE PYELOGRAM, URETEROSCOPY AND STENT PLACEMENT Bilateral 07/25/2015   Procedure: CYSTOSCOPY WITH BILATERAL RETROGRADE PYELOGRAM, BILATERAL URETEROSCOPY AND BILATERAL STENT PLACEMENT;  Surgeon: Malen GauzePatrick L Emonnie Cannady, MD;  Location: [facility];  Service: Urology;  Laterality: Bilateral;  . STONE EXTRACTION WITH BASKET Bilateral 07/25/2015   Procedure: BILATERAL RENAL STONE EXTRACTION WITH BASKET;  Surgeon: Malen GauzePatrick L Romulo Okray, MD;  Location: [facility];  Service: Urology;  Laterality: Bilateral;    Home Medications:  Prescriptions Prior to Admission  Medication Sig Dispense Refill Last Dose  . desogestrel-ethinyl estradiol (KARIVA,AZURETTE,MIRCETTE) 0.15-0.02/0.01 MG (21/5) tablet Take 1 tablet by mouth daily. (Patient taking differently: Take 1 tablet by mouth every evening. ) 3 Package 4 05/18/2016 at Unknown time  . HYDROcodone-acetaminophen (NORCO/VICODIN) 5-325 MG tablet Take 1 tablet by mouth every 6 (six) hours as needed for moderate pain.   05/17/2016 at Unknown time  . ibuprofen (ADVIL,MOTRIN) 200 MG tablet Take 800 mg by mouth every 8 (eight) hours as needed (for pain.).   Past Month at Unknown time  . promethazine (PHENERGAN) 25 MG tablet Take 25 mg by mouth every 6 (six) hours as needed for nausea or vomiting.   Past Month at Unknown time  . tamsulosin (FLOMAX) 0.4 MG CAPS capsule Take 0.4 mg by mouth every evening.   05/18/2016 at Unknown time  . acetaminophen (TYLENOL) 500 MG tablet Take 1,000 mg by mouth every 6 (six) hours as needed (for pain.).       Allergies:  Allergies  Allergen Reactions  . Nystatin Itching    History reviewed. No pertinent family history. Social History:  reports that she has been smoking Cigarettes.  She has a 7.50 pack-year smoking history. She has never used smokeless tobacco. She reports that she drinks about 0.6 oz of alcohol per week . She reports that she does not use drugs.  Review of Systems  All other systems reviewed and are negative.   Physical Exam:  Vital signs in last 24 hours: Temp:  [97.8 F (36.6 C)] 97.8 F (36.6 C) (01/22 1037) Pulse Rate:  [88] 88 (01/22 1037) Resp:  [16] 16 (01/22 1037) BP: (125)/(67) 125/67 (01/22 1037) SpO2:  [99 %] 99 % (01/22 1037) Weight:  [88.2 kg (194 lb 8 oz)] 88.2 kg (194 lb 8 oz) (01/22 1037) Physical Exam  Constitutional: She is oriented to person, place, and time. She appears well-developed and well-nourished.  HENT:  Head: Normocephalic and atraumatic.  Eyes: EOM are normal. Pupils are equal, round, and reactive to light.  Neck: Normal range of motion. No thyromegaly present.  Cardiovascular: Normal rate and regular rhythm.   Respiratory: Effort normal. No respiratory distress.  GI: Soft. She exhibits no distension.  Musculoskeletal: Normal range of motion. She exhibits no edema.  Neurological: She is alert and oriented to person, place, and time.  Skin: Skin is warm and dry.  Psychiatric: She has a normal mood and affect. Her behavior is normal. Judgment and thought content normal.    Laboratory Data:  No results found for this or any previous visit (from the past 24 hour(s)). No results found for this or any previous visit (from the past 240 hour(s)). Creatinine:  Recent Labs  05/12/16 1420  CREATININE 0.64   Baseline Creatinine: 0.6  Impression/Assessment:  36yo with bilateral renal calculi  Plan:  The risks/benefits/alternatives to bilateral stone extraction was explained to the patietn and she understands and wishes to  proceed with surgery  Wilkie Aye 05/19/2016, 1:11 PM

## 2016-05-19 NOTE — Anesthesia Procedure Notes (Signed)
Procedure Name: LMA Insertion Date/Time: 05/19/2016 1:30 PM Performed by: Maris BergerENENNY, Garlan Drewes T Pre-anesthesia Checklist: Patient identified, Emergency Drugs available, Suction available and Patient being monitored Patient Re-evaluated:Patient Re-evaluated prior to inductionOxygen Delivery Method: Circle system utilized Preoxygenation: Pre-oxygenation with 100% oxygen Intubation Type: IV induction Ventilation: Mask ventilation without difficulty LMA: LMA inserted LMA Size: 4.0 Number of attempts: 1 Airway Equipment and Method: Bite block Placement Confirmation: positive ETCO2 Tube secured with: Tape Dental Injury: Teeth and Oropharynx as per pre-operative assessment

## 2016-05-20 ENCOUNTER — Encounter (HOSPITAL_BASED_OUTPATIENT_CLINIC_OR_DEPARTMENT_OTHER): Payer: Self-pay | Admitting: Urology

## 2016-05-21 ENCOUNTER — Ambulatory Visit: Payer: PRIVATE HEALTH INSURANCE | Admitting: Urology

## 2016-05-22 NOTE — Op Note (Signed)
Marland Kitchen.Preoperative diagnosis: bilateral renal calculi  Postoperative diagnosis: Same  Procedure: 1 cystoscopy 2. bilateralretrograde pyelography 3.  Intraoperative fluoroscopy, under one hour, with interpretation 4.  Bilateral ureteroscopic stone manipulation with basket extraction 5.  bilateral 6 x 26 JJ stent exchange  Attending: Cleda MccreedyPatrick Mackenzie  Anesthesia: General  Estimated blood loss: None  Drains: bilateral 6 x 26 JJ ureteral stent with tether  Specimens: stone for analysis  Antibiotics: ancef  Findings: bilateral mid and lower pole renal calculi. No hydronephrosis. No masses/lesions in the bladder. Ureteral orifices in normal anatomic location.  Indications: Patient is a 36 year old female with a history of bilateral renal calculi and bilateral flank pain. After discussing treatment options, they decided proceed with bilateral ureteroscopic stone manipulation.  Procedure her in detail: The patient was brought to the operating room and a brief timeout was done to ensure correct patient, correct procedure, correct site.  General anesthesia was administered patient was placed in dorsal lithotomy position.  Her genitalia was then prepped and draped in usual sterile fashion.  A rigid 22 French cystoscope was passed in the urethra and the bladder.  Bladder was inspected free masses or lesions.  the ureteral orifices were in the normal orthotopic locations. a 6 french ureteral catheter was then instilled into the left ureteral orifice.  a gentle retrograde was obtained and findings noted above. We then advanced a zipwire through the catheter and up to the renal pelvis.  we then removed the cystoscope and cannulated the left ureteral orifice with a semirigid ureteroscope.  We located no stone in the ureter. We then placed a sensor wire up to the renal pelvis. We removed the scope and advanced a 12/14 x 38cm access sheath up to the renal pelvis. We then used the flexible ureteroscope to perform  nephroscopy. We located calculi int he mid and lower poles which were removed with an NGage basket. Once the stone were removed we then removed the access sheath under direct vision and noted to injury to the ureter.  We then placed a 6 x 26 double-j ureteral stent over the original zip wire. We then removed the wire and good coil was noted in the the renal pelvis under fluoroscopy and the bladder under direct vision.  We then turned out attention to the right side. We then advanced a zipwire through the catheter and up to the renal pelvis.  we then removed the cystoscope and cannulated the left ureteral orifice with a semirigid ureteroscope.  We located no stone in the ureter. We then placed a sensor wire up to the renal pelvis. We removed the scope and advanced a 12/14 x 38cm access sheath up to the renal pelvis. We then used the flexible ureteroscope to perform nephroscopy. We located calculi int he mid and lower poles which were removed with an NGage basket. Once the stone were removed we then removed the access sheath under direct vision and noted to injury to the ureter. we then placed a 6 x 26 double-j ureteral stent over the original zip wire.  We then removed the wire and good coil was noted in the the renal pelvis under fluoroscopy and the bladder under direct vision.   the bladder was then drained and this concluded the procedure which was well tolerated by patient.  Complications: None  Condition: Stable, extubated, transferred to PACU  Plan: Patient is to be discharged home as to follow-up in 2 weeks. She is to remove her stents by pulling the tether in 72 hours

## 2016-06-04 ENCOUNTER — Ambulatory Visit (INDEPENDENT_AMBULATORY_CARE_PROVIDER_SITE_OTHER): Payer: PRIVATE HEALTH INSURANCE | Admitting: Urology

## 2016-06-04 DIAGNOSIS — N200 Calculus of kidney: Secondary | ICD-10-CM | POA: Diagnosis not present

## 2016-06-10 ENCOUNTER — Other Ambulatory Visit: Payer: Self-pay | Admitting: Urology

## 2016-06-10 DIAGNOSIS — N200 Calculus of kidney: Secondary | ICD-10-CM

## 2016-07-04 ENCOUNTER — Ambulatory Visit (HOSPITAL_COMMUNITY)
Admission: RE | Admit: 2016-07-04 | Discharge: 2016-07-04 | Disposition: A | Discharge status: Home / Self Care | Payer: PRIVATE HEALTH INSURANCE | Source: Ambulatory Visit | Attending: Urology | Admitting: Urology

## 2016-07-04 DIAGNOSIS — N2889 Other specified disorders of kidney and ureter: Secondary | ICD-10-CM | POA: Diagnosis not present

## 2016-07-04 DIAGNOSIS — N200 Calculus of kidney: Secondary | ICD-10-CM

## 2016-07-16 ENCOUNTER — Ambulatory Visit (INDEPENDENT_AMBULATORY_CARE_PROVIDER_SITE_OTHER): Payer: PRIVATE HEALTH INSURANCE | Admitting: Urology

## 2016-07-16 DIAGNOSIS — N200 Calculus of kidney: Secondary | ICD-10-CM

## 2016-07-28 ENCOUNTER — Other Ambulatory Visit: Payer: PRIVATE HEALTH INSURANCE | Admitting: Adult Health

## 2016-08-07 ENCOUNTER — Other Ambulatory Visit (HOSPITAL_COMMUNITY)
Admission: RE | Admit: 2016-08-07 | Discharge: 2016-08-07 | Disposition: A | Discharge status: Home / Self Care | Payer: PRIVATE HEALTH INSURANCE | Source: Ambulatory Visit | Attending: Adult Health | Admitting: Adult Health

## 2016-08-07 ENCOUNTER — Encounter: Payer: Self-pay | Admitting: Adult Health

## 2016-08-07 ENCOUNTER — Ambulatory Visit (INDEPENDENT_AMBULATORY_CARE_PROVIDER_SITE_OTHER): Payer: PRIVATE HEALTH INSURANCE | Admitting: Adult Health

## 2016-08-07 VITALS — BP 114/78 | HR 60 | Ht 64.75 in | Wt 189.0 lb

## 2016-08-07 DIAGNOSIS — Z01419 Encounter for gynecological examination (general) (routine) without abnormal findings: Secondary | ICD-10-CM

## 2016-08-07 DIAGNOSIS — Z30011 Encounter for initial prescription of contraceptive pills: Secondary | ICD-10-CM

## 2016-08-07 MED ORDER — NORETHINDRONE 0.35 MG PO TABS: 1.0000 | ORAL_TABLET | Freq: Every day | ORAL | 11 refills | Status: DC

## 2016-08-07 NOTE — Progress Notes (Signed)
Patient ID: Stacey Wilson, female   DOB: 06/16/1980, 36 y.o.   MRN: [id number] History of Present Illness: Stacey Wilson is a 36 year old white female, married in for a well woman gyn exam and pap.She needs OCs refilled, she has been off OCs since January.She is smoker. PCP is Dr Lilyan Punt.   Current Medications, Allergies, Past Medical History, Past Surgical History, Family History and Social History were reviewed in [name] record.     Review of Systems: Patient denies any headaches, hearing loss, fatigue, blurred vision, shortness of breath, chest pain, abdominal pain, problems with bowel movements, urination, or intercourse. No joint pain or mood swings.    Physical Exam:BP 114/78 (BP Location: Left Arm, Patient Position: Sitting, Cuff Size: Normal)   Pulse 60   Ht 5' 4.75" (1.645 m)   Wt 189 lb (85.7 kg)   LMP 07/26/2016   BMI 31.69 kg/m  General:  Well developed, well nourished, no acute distress Skin:  Warm and dry Neck:  Midline trachea, normal thyroid, good ROM, no lymphadenopathy Lungs; Clear to auscultation bilaterally Breast:  No dominant palpable mass, retraction, or nipple discharge, L>R, has scar right breast, lower inner quadrant form biopsy  Cardiovascular: Regular rate and rhythm Abdomen:  Soft, non tender, no hepatosplenomegaly Pelvic:  External genitalia is normal in appearance, no lesions.  The vagina is normal in appearance. Urethra has no lesions or masses. The cervix is bulbous,pap with HPV and GC/CHL performed.  Uterus is felt to be normal size, shape, and contour.  No adnexal masses or tenderness noted.Bladder is non tender, no masses felt. Extremities/musculoskeletal:  No swelling or varicosities noted, no clubbing or cyanosis Psych:  No mood changes, alert and cooperative,seems happy PHQ 2 score 0.Discussed since she smokes will need to use birth control without estrogen ,such as IUD, nexplanon ,depo, POP or tubal, she will start on POP  and thinks she wants tubal. Handout given on tubal.   Impression: 1. Pap smear, as part of routine gynecological examination   2. Encounter for initial prescription of contraceptive pills       Plan: Meds ordered this encounter  Medications  . norethindrone (MICRONOR,CAMILA,ERRIN) 0.35 MG tablet    Sig: Take 1 tablet (0.35 mg total) by mouth daily.    Dispense:  1 Package    Refill:  11    Order Specific Question:   Supervising Provider    Answer:   Lazaro Arms [[id number]]  Start with next period Use condoms for 1 month Review handout on tubal ligation Physical in 1 year, pap in 3 if normal  Labs with PCP

## 2016-08-12 LAB — CYTOLOGY - PAP
CHLAMYDIA, DNA PROBE: NEGATIVE
Diagnosis: NEGATIVE
HPV: NOT DETECTED
NEISSERIA GONORRHEA: NEGATIVE

## 2016-09-04 ENCOUNTER — Other Ambulatory Visit: Payer: Self-pay | Admitting: Urology

## 2016-09-04 DIAGNOSIS — N200 Calculus of kidney: Secondary | ICD-10-CM

## 2016-09-27 NOTE — Addendum Note (Signed)
Addendum  created 09/27/16 0831 by Tomma Ehinger, MD   Sign clinical note    

## 2016-10-22 ENCOUNTER — Ambulatory Visit: Payer: PRIVATE HEALTH INSURANCE | Admitting: Urology

## 2016-12-23 ENCOUNTER — Encounter: Payer: Self-pay | Admitting: Family Medicine

## 2016-12-23 ENCOUNTER — Ambulatory Visit (INDEPENDENT_AMBULATORY_CARE_PROVIDER_SITE_OTHER): Payer: PRIVATE HEALTH INSURANCE | Admitting: Family Medicine

## 2016-12-23 VITALS — BP 118/78 | Ht 65.0 in | Wt 192.4 lb

## 2016-12-23 DIAGNOSIS — J019 Acute sinusitis, unspecified: Secondary | ICD-10-CM | POA: Diagnosis not present

## 2016-12-23 DIAGNOSIS — J029 Acute pharyngitis, unspecified: Secondary | ICD-10-CM | POA: Diagnosis not present

## 2016-12-23 LAB — POCT RAPID STREP A (OFFICE): RAPID STREP A SCREEN: NEGATIVE

## 2016-12-23 MED ORDER — AMOXICILLIN 500 MG PO TABS: 500.0000 mg | ORAL_TABLET | Freq: Three times a day (TID) | ORAL | 1 refills | Status: DC

## 2016-12-23 NOTE — Progress Notes (Signed)
   Subjective:    Patient ID: Stacey Wilson, female    DOB: 05-21-1980, 36 y.o.   MRN: [id number]  Sore Throat   This is a new problem. The current episode started in the past 7 days. Associated symptoms include congestion, coughing, ear pain and headaches. Pertinent negatives include no shortness of breath. She has tried NSAIDs (Sinus cold, allergy medication ) for the symptoms.   Significant sore throat some head congestion sinus pressure pain discomfort feels achy tired symptoms over the past several days  Patient states no other concerns this visit.   Results for orders placed or performed in visit on 12/23/16  POCT rapid strep A  Result Value Ref Range   Rapid Strep A Screen Negative Negative    Review of Systems  Constitutional: Negative for activity change and fever.  HENT: Positive for congestion, ear pain and rhinorrhea.   Eyes: Negative for discharge.  Respiratory: Positive for cough. Negative for shortness of breath and wheezing.   Cardiovascular: Negative for chest pain.  Neurological: Positive for headaches.       Objective:   Physical Exam  Constitutional: She appears well-developed.  HENT:  Head: Normocephalic.  Right Ear: External ear normal.  Left Ear: External ear normal.  Nose: Nose normal.  Mouth/Throat: Oropharynx is clear and moist. No oropharyngeal exudate.  Eyes: Right eye exhibits no discharge. Left eye exhibits no discharge.  Neck: Neck supple. No tracheal deviation present.  Cardiovascular: Normal rate and normal heart sounds.   No murmur heard. Pulmonary/Chest: Effort normal and breath sounds normal. She has no wheezes. She has no rales.  Lymphadenopathy:    She has no cervical adenopathy.  Skin: Skin is warm and dry.  Nursing note and vitals reviewed.  Tenderness in frontal sinuses throat appears normal neck tenderness anterior cervical region       Assessment & Plan:  Viral syndrome Upper respiratory Sinusitis Antibiotics prescribed  warning signs discussed follow-up if problems

## 2016-12-24 LAB — STREP A DNA PROBE: STREP GP A DIRECT, DNA PROBE: NEGATIVE

## 2017-01-13 ENCOUNTER — Encounter: Payer: Self-pay | Admitting: Family Medicine

## 2017-03-04 ENCOUNTER — Encounter: Payer: Self-pay | Admitting: Family Medicine

## 2017-03-04 ENCOUNTER — Ambulatory Visit (INDEPENDENT_AMBULATORY_CARE_PROVIDER_SITE_OTHER): Payer: PRIVATE HEALTH INSURANCE | Admitting: Family Medicine

## 2017-03-04 VITALS — BP 110/80 | Temp 98.5°F | Wt 196.5 lb

## 2017-03-04 DIAGNOSIS — J019 Acute sinusitis, unspecified: Secondary | ICD-10-CM | POA: Diagnosis not present

## 2017-03-04 MED ORDER — AMOXICILLIN-POT CLAVULANATE 875-125 MG PO TABS: 1.0000 | ORAL_TABLET | Freq: Two times a day (BID) | ORAL | 0 refills | Status: AC

## 2017-03-04 MED ORDER — BENZONATATE 100 MG PO CAPS: 100.0000 mg | ORAL_CAPSULE | Freq: Four times a day (QID) | ORAL | 0 refills | Status: DC | PRN

## 2017-03-04 NOTE — Progress Notes (Signed)
   Subjective:    Patient ID: Carlton AdamMiranda Cordts, female    DOB: 04-18-1981, 36 y.o.   MRN: [id number]  Cough  This is a new problem. The current episode started in the past 7 days. The cough is non-productive. Associated symptoms include ear pain, headaches and a sore throat.   Patient states no other concerns this visit. Started Friday  Throat very scratcy at the start  Took mucinex and otc meds  Took alka sel cold and coug  Head stopped up, throat hurt and ears hurt  Pos nasal disch  Lost voice on saun  Cough bad and non productive  Pos smoker  [facility]   [place]  Work nrse   Review of Systems  HENT: Positive for ear pain and sore throat.   Respiratory: Positive for cough.   Neurological: Positive for headaches.       Objective:   Physical Exam  Alert, mild malaise. Hydration good Vitals stable. frontal/ maxillary tenderness evident positive nasal congestion. pharynx normal neck supple  lungs clear/no crackles or wheezes. heart regular in rhythm       Assessment & Plan:  Impression rhinosinusitis Tessalon Perles prescribed symptom care discussed likely post viral, discussed with patient. plan antibiotics prescribed. Questions answered. Symptomatic care discussed. warning signs discussed. WSL /Bronchitis

## 2017-04-04 IMAGING — US US RENAL
1 series · 14 of 25 positions shown · non-contrast
Comparison: 02/11/2016, CT 07/04/2015

CLINICAL DATA: Follow-up kidney stone

EXAM:
RENAL / URINARY TRACT ULTRASOUND COMPLETE

[Series 1: us renal · 0.20mm/px · 14 of 86 slices shown]
[im 1/86]
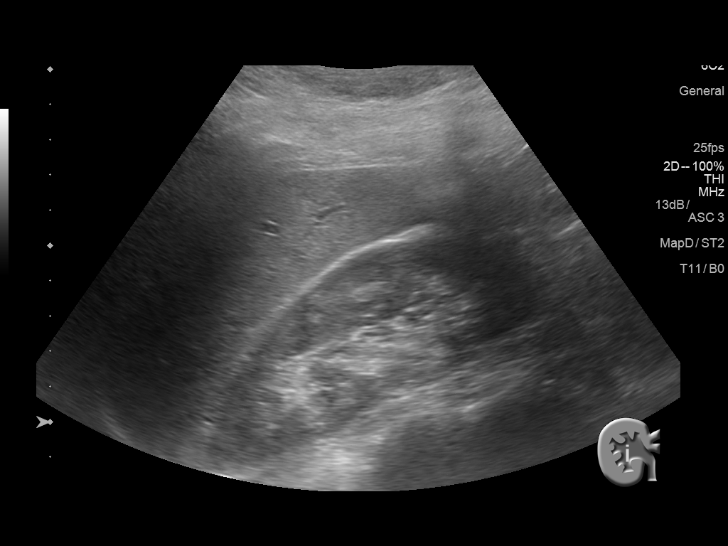
[im 8/86]
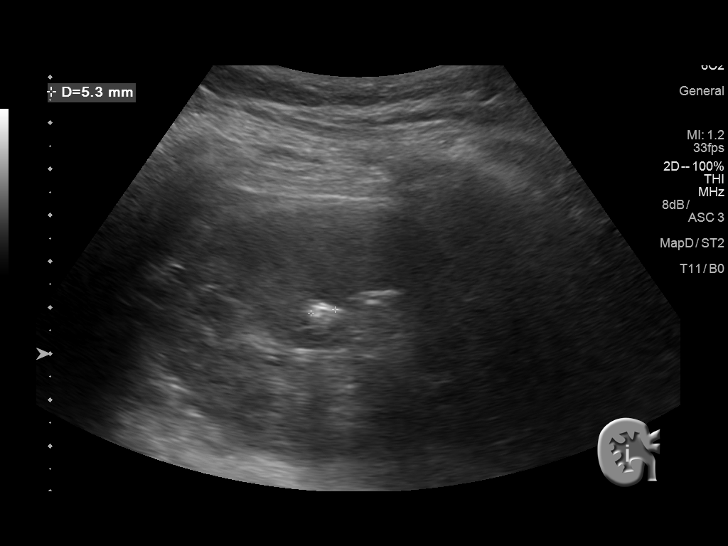
[im 15/86]
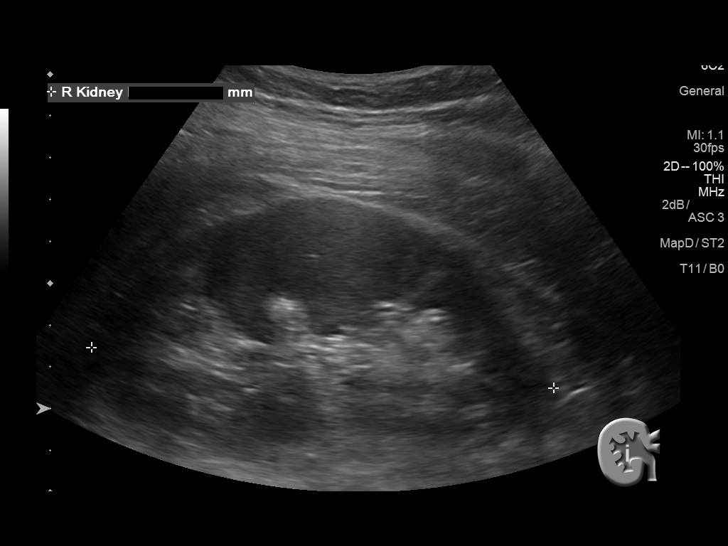
[im 22/86]
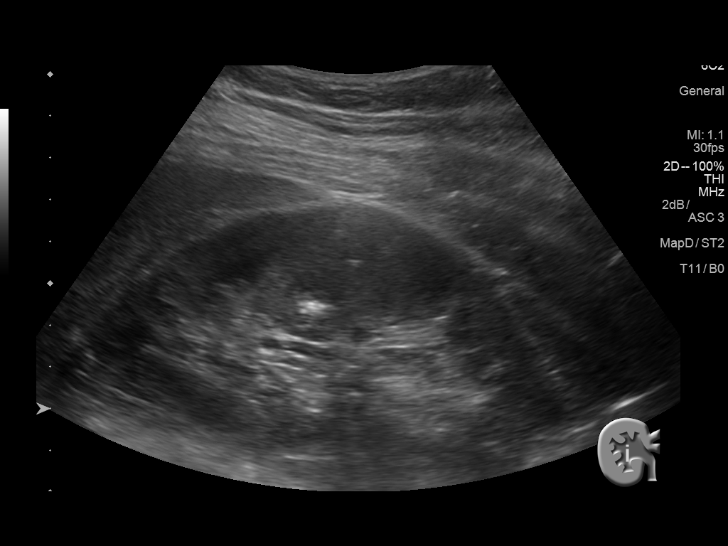
[im 29/86]
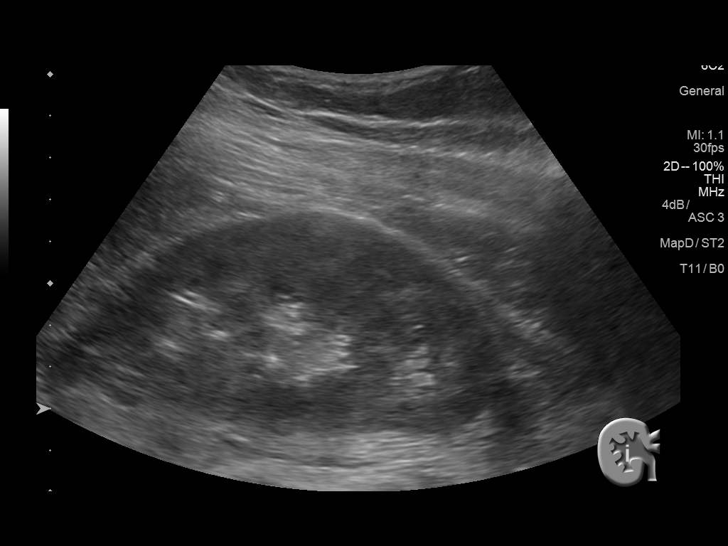
[im 32/86]
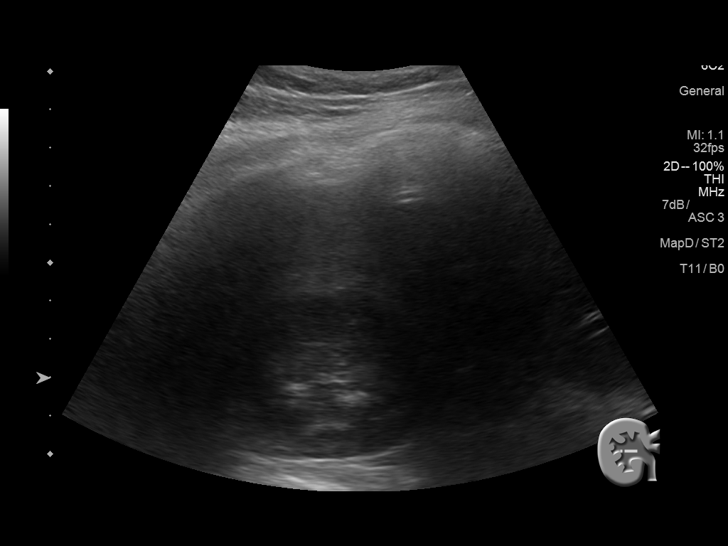
[im 39/86]
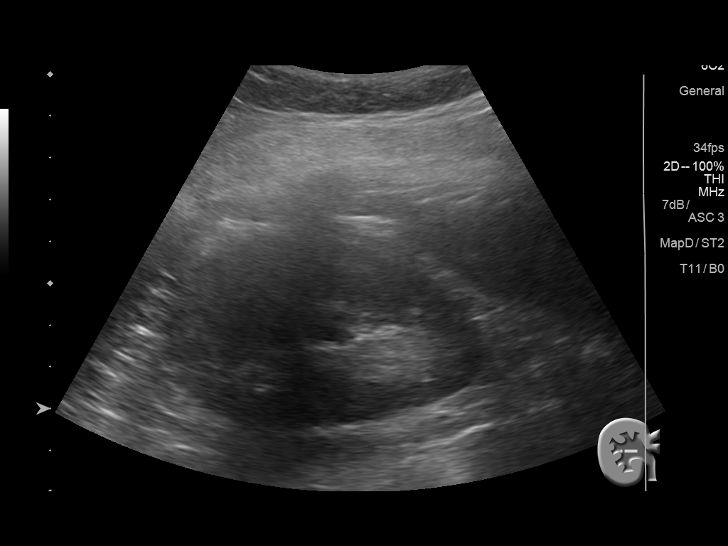
[im 47/86]
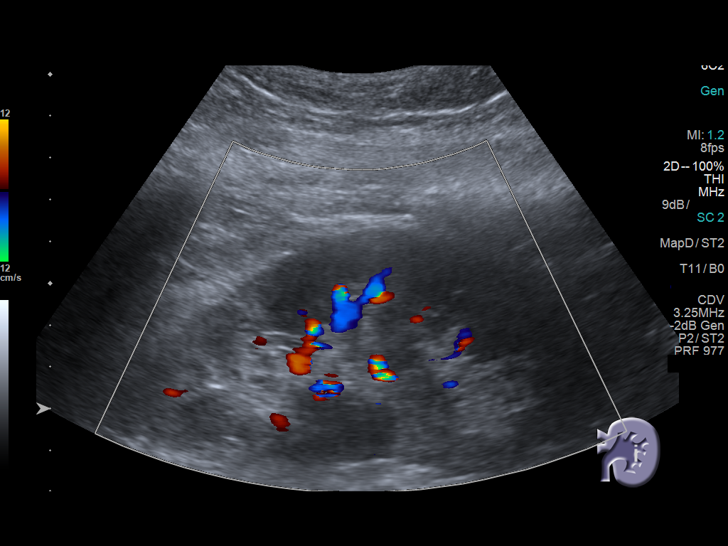
[im 54/86]
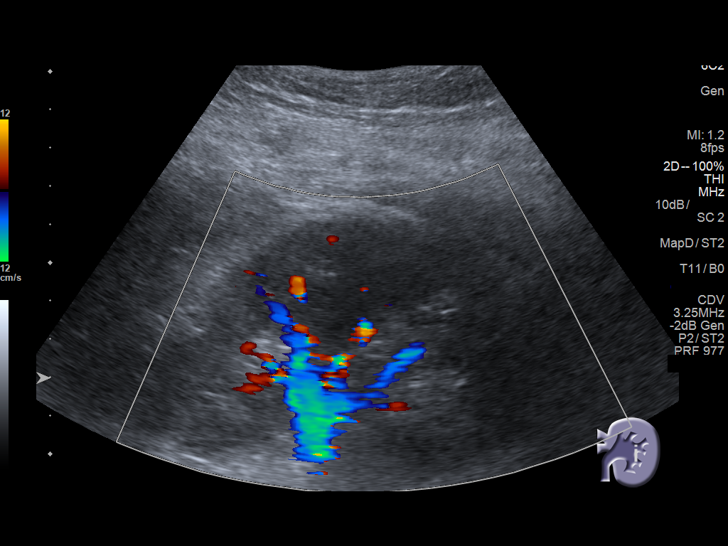
[im 57/86]
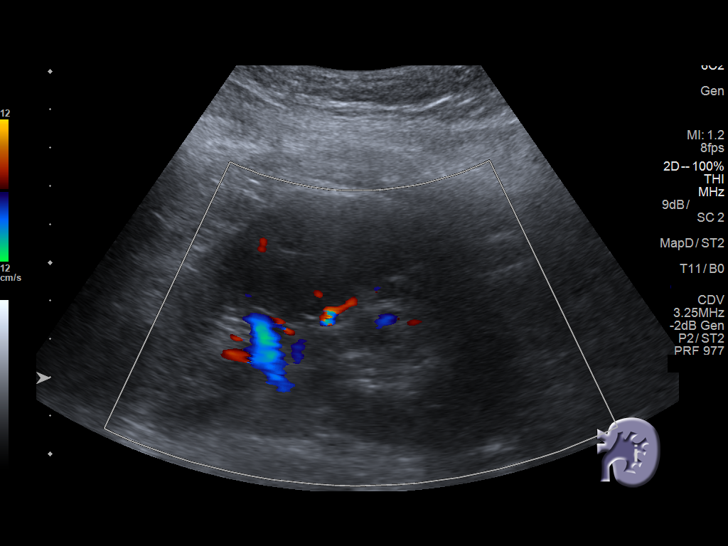
[im 64/86]
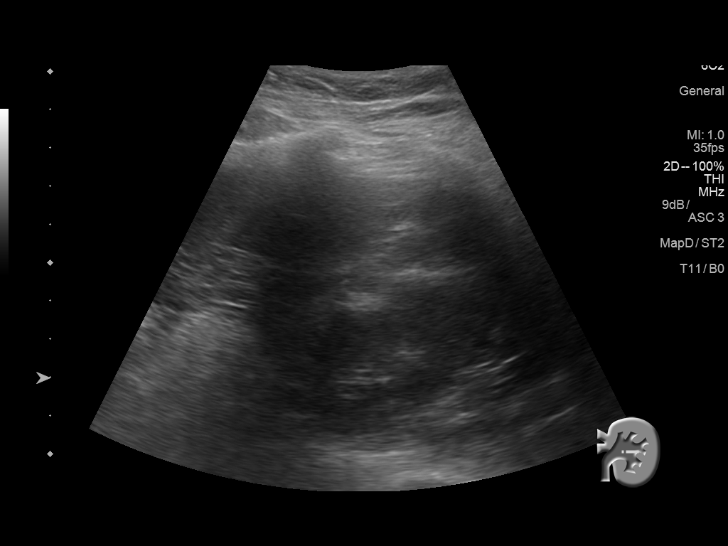
[im 71/86]
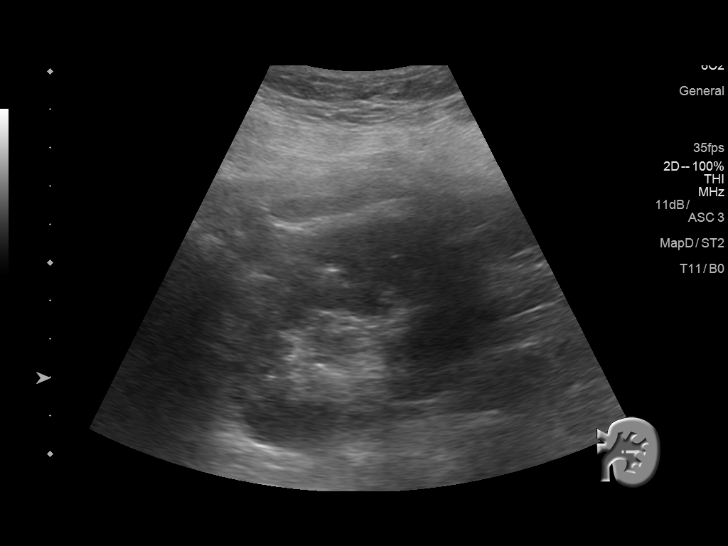
[im 78/86]
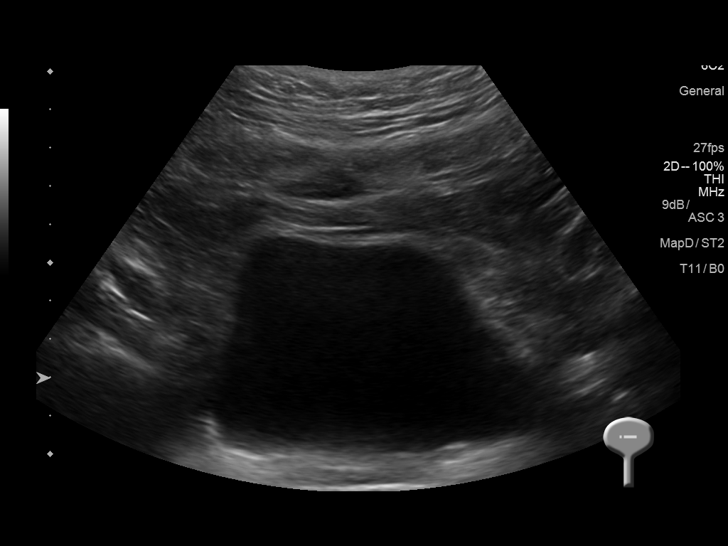
[im 86/86]
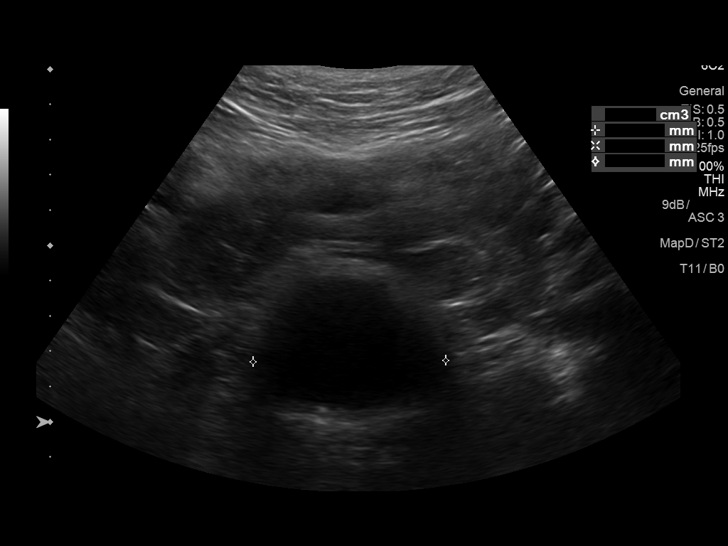

[14 of 25 positions shown; findings below may reference images not displayed]

FINDINGS: Right Kidney:

Length: 11.1 cm. Echogenicity within normal limits. No mass or
hydronephrosis visualized. Probable shadowing stone in the midpole
measuring 5 mm

Left Kidney:

Length: 11.9 cm. Echogenicity within normal limits. Mild fullness of
the lower left renal pelvis. No definitive shadowing stones are
visualized.

Bladder:

Appears normal for degree of bladder distention. Prevoid bladder
volume 136 mL. Postvoid bladder volume 21 mL.
IMPRESSION: 1. Mild enlargement of the left lower renal pelvis without frank
hydronephrosis. Previously noted multiple shadowing stones are not
clearly visualized.
2. Probable 5 mm shadowing stone in the mid right kidney. No
hydronephrosis.

## 2017-04-08 ENCOUNTER — Ambulatory Visit (INDEPENDENT_AMBULATORY_CARE_PROVIDER_SITE_OTHER): Payer: PRIVATE HEALTH INSURANCE | Admitting: Nurse Practitioner

## 2017-04-08 ENCOUNTER — Encounter: Payer: Self-pay | Admitting: Nurse Practitioner

## 2017-04-08 VITALS — BP 116/74 | Temp 98.1°F | Ht 65.0 in | Wt 197.0 lb

## 2017-04-08 DIAGNOSIS — B349 Viral infection, unspecified: Secondary | ICD-10-CM | POA: Diagnosis not present

## 2017-04-08 NOTE — Progress Notes (Signed)
Subjective: Presents for complaints of sudden onset fever runny nose and cough that began about 72 hours ago.  For the past 2 days patient has been at home resting.  Extreme muscle aches.  Fatigue.  States the fever broke last night.  Muscle aches resolved today.  Frequent cough producing green yellow mucus.  No headache sore throat or ear pain.  No wheezing.  No vomiting diarrhea or abdominal pain.  Taking fluids well.  Voiding normal limits.  Objective:   BP 116/74   Temp 98.1 F (36.7 C) (Oral)   Ht 5\' 5"  (1.651 m)   Wt 197 lb (89.4 kg)   BMI 32.78 kg/m  NAD.  Alert, oriented.  TMs cloudy effusion more on the right.  No erythema.  Pharynx posterior mildly erythematous with PND noted.  Neck supple with mild soft nontender anterior adenopathy.  Lungs clear.  Heart regular rate and rhythm.  Assessment:  Viral illness    Plan: Reviewed symptomatic care and warning signs.  Expect continued improvement in symptoms.  Call back in 48 hours if no improvement, sooner if worse.

## 2017-07-21 ENCOUNTER — Ambulatory Visit (INDEPENDENT_AMBULATORY_CARE_PROVIDER_SITE_OTHER): Payer: PRIVATE HEALTH INSURANCE | Admitting: Nurse Practitioner

## 2017-07-21 ENCOUNTER — Encounter: Payer: Self-pay | Admitting: Nurse Practitioner

## 2017-07-21 VITALS — BP 138/84 | Ht 65.0 in | Wt 194.0 lb

## 2017-07-21 DIAGNOSIS — F430 Acute stress reaction: Secondary | ICD-10-CM

## 2017-07-21 DIAGNOSIS — F411 Generalized anxiety disorder: Secondary | ICD-10-CM

## 2017-07-21 MED ORDER — CITALOPRAM HYDROBROMIDE 20 MG PO TABS: 20.0000 mg | ORAL_TABLET | Freq: Every day | ORAL | 2 refills | Status: DC

## 2017-07-21 MED ORDER — CLONAZEPAM 0.5 MG PO TABS: ORAL_TABLET | ORAL | 0 refills | Status: DC

## 2017-07-21 NOTE — Progress Notes (Signed)
Subjective: Presents for complaints of a flareup of her anxiety.  Has been under tremendous stress lately.  Her daughter has had mental illness issues which she is dealing with.  Has a grandmother with early dementia that she has to check on frequently.  Has a grandfather in the hospital.  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  Normally can handle the stress but feels very overwhelmed.  Has had a slight increase in smoking, 1/2 pack/day.  Forgetful at times.  Difficulty focusing.  This is beginning to affect her job.  Has occasional mild panic attack where she feels like she cannot breathe.  Aggravated at times.  Emotional lability.  Fatigue.  Objective:   BP 138/84   Ht 5\' 5"  (1.651 m)   Wt 194 lb (88 kg)   BMI 32.28 kg/m  NAD.  Alert, oriented.  Patient becomes very upset when talking about her current family situation.  Thoughts logical coherent and relevant.  Dressed appropriately.  Making good eye contact.  Lungs clear.  Heart regular rate and rhythm.  Assessment:   Problem List Items Addressed This Visit      Other   Anxiety - Primary   Relevant Medications   citalopram (CELEXA) 20 MG tablet       Plan:   Meds ordered this encounter  Medications  . citalopram (CELEXA) 20 MG tablet    Sig: Take 1 tablet (20 mg total) by mouth daily.    Dispense:  30 tablet    Refill:  2    Order Specific Question:   Supervising Provider    Answer:   Merlyn AlbertLUKING, WILLIAM S [[id number]]  . clonazePAM (KLONOPIN) 0.5 MG tablet    Sig: Take 1/2-1 tab po BID prn anxiety    Dispense:  30 tablet    Refill:  0    Order Specific Question:   Supervising Provider    Answer:   Merlyn AlbertLUKING, WILLIAM S [[id number]]   Patient was on medication in 2015 which worked well. Plan to restart that regimen. Lengthy discussion regarding the importance of stress reduction.  25 minutes was spent with the patient.  This statement verifies that 25 minutes was  indeed spent with the patient. Greater than half the time was spent in discussion, counseling and answering questions  regarding the issues that the patient came in for today as reflected in the diagnosis (s) please refer to documentation for further details. Return in about 3 months (around 10/21/2017) for recheck. Call back sooner if no improvement or if worse.

## 2017-08-26 ENCOUNTER — Other Ambulatory Visit: Payer: Self-pay | Admitting: Adult Health

## 2017-08-26 ENCOUNTER — Telehealth: Payer: Self-pay | Admitting: Adult Health

## 2017-08-26 NOTE — Telephone Encounter (Signed)
Patient called stating that she would like for [facility] to fax in her refill of her [facility], pt has already contacted her pharmacy and they have sent it. Pt states that she needs to start it today, she is completely out. Please contact pt

## 2017-08-26 NOTE — Telephone Encounter (Signed)
Left message that OCs refilled  

## 2017-09-30 ENCOUNTER — Telehealth: Payer: Self-pay | Admitting: *Deleted

## 2017-09-30 NOTE — Telephone Encounter (Signed)
It would be fine to go ahead with a work note for those stated days

## 2017-09-30 NOTE — Telephone Encounter (Signed)
Patient called stating she thinks she was sick last week with a stomach bug and she needs a doctors note for 5/29-5/30 so she will get paid for work, patient stated she did not know not having a doctors note she wouldn't get paid.Marland Kitchen. Please advise patient once done, patient stated she will need note faxed.

## 2017-10-01 ENCOUNTER — Encounter: Payer: Self-pay | Admitting: Family Medicine

## 2017-10-01 NOTE — Telephone Encounter (Signed)
[facility] notifying [facility] this is complete.

## 2017-10-23 ENCOUNTER — Ambulatory Visit (INDEPENDENT_AMBULATORY_CARE_PROVIDER_SITE_OTHER): Payer: Managed Care, Other (non HMO) | Admitting: Nurse Practitioner

## 2017-10-23 ENCOUNTER — Encounter: Payer: Self-pay | Admitting: Nurse Practitioner

## 2017-10-23 VITALS — BP 114/72 | Ht 65.0 in | Wt 196.4 lb

## 2017-10-23 DIAGNOSIS — F419 Anxiety disorder, unspecified: Secondary | ICD-10-CM

## 2017-10-23 DIAGNOSIS — B353 Tinea pedis: Secondary | ICD-10-CM | POA: Diagnosis not present

## 2017-10-23 MED ORDER — CITALOPRAM HYDROBROMIDE 20 MG PO TABS: 20.0000 mg | ORAL_TABLET | Freq: Every day | ORAL | 1 refills | Status: DC

## 2017-10-23 MED ORDER — KETOCONAZOLE 2 % EX CREA
1.0000 | TOPICAL_CREAM | Freq: Two times a day (BID) | CUTANEOUS | 4 refills | Status: DC
Start: 2017-10-23 — End: 2018-04-26

## 2017-10-23 MED ORDER — TRIAMCINOLONE ACETONIDE 0.1 % EX CREA: 1.0000 "application " | TOPICAL_CREAM | Freq: Two times a day (BID) | CUTANEOUS | 0 refills | Status: DC

## 2017-10-23 MED ORDER — CLONAZEPAM 0.5 MG PO TABS: ORAL_TABLET | ORAL | 0 refills | Status: DC

## 2017-10-24 ENCOUNTER — Encounter: Payer: Self-pay | Admitting: Nurse Practitioner

## 2017-10-24 NOTE — Progress Notes (Signed)
Subjective:  Presents for recheck on her anxiety. Doing much better. Rare use of Klonopin.  [facility] 10/23/2017 07/21/2017 08/07/2016  Decreased Interest 1 2 0  Down, Depressed, Hopeless 1 1 0  PHQ - 2 Score 2 3 0  Altered sleeping 1 1 -  Tired, decreased energy 1 2 -  Change in appetite 1 2 -  Feeling bad or failure about yourself  0 1 -  Trouble concentrating 0 1 -  Moving slowly or fidgety/restless 0 0 -  Suicidal thoughts 0 0 -  PHQ-9 Score 5 10 -  Difficult doing work/chores Somewhat difficult Somewhat difficult -   GAD 7 : Generalized Anxiety Score 10/23/2017 07/21/2017  Nervous, Anxious, on Edge 1 2  Control/stop worrying 1 2  Worry too much - different things 1 2  Trouble relaxing 0 1  Restless 0 0  Easily annoyed or irritable 1 2  Afraid - awful might happen 0 0  Total GAD 7 Score 4 9  Anxiety Difficulty Somewhat difficult Somewhat difficult    Also c/o athletes foot on the right toes and foot. No relief with OTC products.   Objective:   BP 114/72   Ht 5\' 5"  (1.651 m)   Wt 196 lb 6.4 oz (89.1 kg)   BMI 32.68 kg/m  NAD.  Alert, oriented.  Calm affect.  Thoughts logical coherent and relevant.  Dressed appropriately.  Making good eye contact.  Patches of moderately erythematous dry shiny rash noted along several of the toes on the right foot.  Assessment:   Problem List Items Addressed This Visit      Other   Anxiety - Primary   Relevant Medications   citalopram (CELEXA) 20 MG tablet    Other Visit Diagnoses    Tinea pedis of right foot       Relevant Medications   ketoconazole (NIZORAL) 2 % cream       Plan:   Meds ordered this encounter  Medications  . clonazePAM (KLONOPIN) 0.5 MG tablet    Sig: Take 1/2-1 tab po BID prn anxiety    Dispense:  30 tablet    Refill:  0    Order Specific Question:   Supervising Provider    Answer:   Merlyn AlbertLUKING, WILLIAM S [[id number]]  . citalopram (CELEXA) 20 MG tablet    Sig: Take 1 tablet (20 mg total) by mouth  daily.    Dispense:  90 tablet    Refill:  1    Order Specific Question:   Supervising Provider    Answer:   Merlyn AlbertLUKING, WILLIAM S [[id number]]  . ketoconazole (NIZORAL) 2 % cream    Sig: Apply 1 application topically 2 (two) times daily.    Dispense:  30 g    Refill:  4    Order Specific Question:   Supervising Provider    Answer:   Merlyn AlbertLUKING, WILLIAM S [[id number]]  . triamcinolone cream (KENALOG) 0.1 %    Sig: Apply 1 application topically 2 (two) times daily. Prn rash; use up to 2 weeks    Dispense:  30 g    Refill:  0    Order Specific Question:   Supervising Provider    Answer:   Merlyn AlbertLUKING, WILLIAM S [[id number]]   Continue current medications as directed.  Apply ketoconazole mixed with triamcinolone to rash on toes twice a day up to 2 weeks.  Call back if persists. Return in about 6 months (around 04/24/2018) for recheck.  Call back sooner if  any problems. Note patient gets her physicals through gynecology.

## 2017-12-13 IMAGING — RF DG RETROGRADE PYELOGRAM
1 series · 7 of 7 positions shown · non-contrast
Comparison: CT 07/04/2015

CLINICAL DATA: Bilateral nephrolithiasis and stent placement

EXAM:
RETROGRADE PYELOGRAM

[Series 1: run · 7 of 7 slices shown]
[im 1/7]
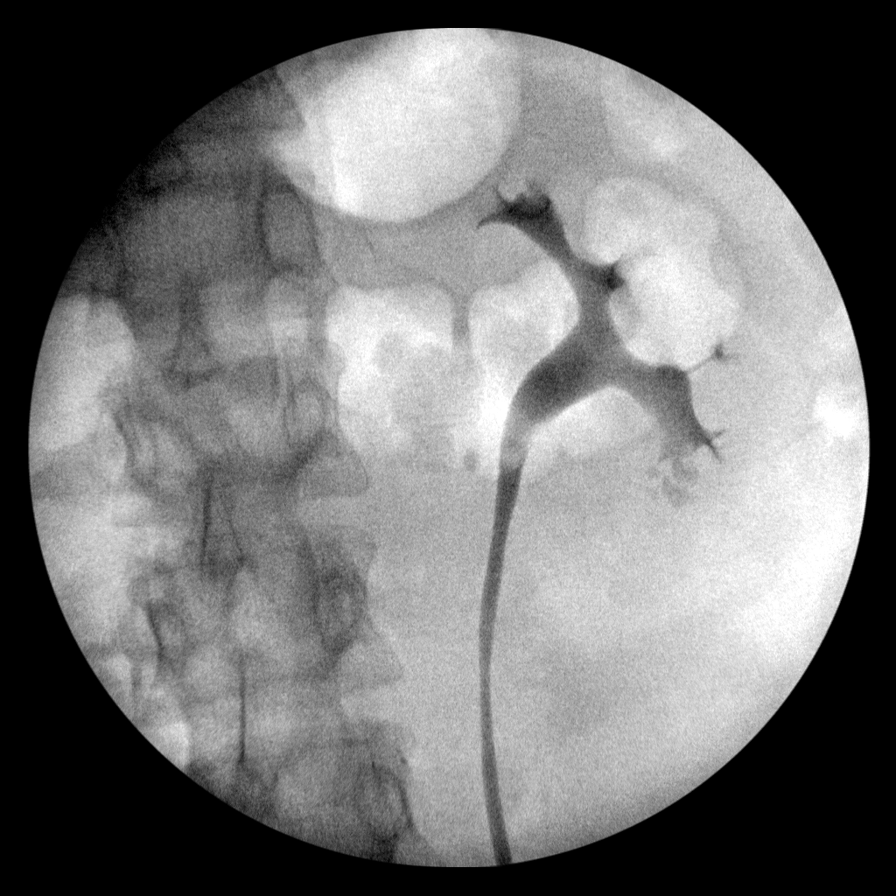
[im 2/7]
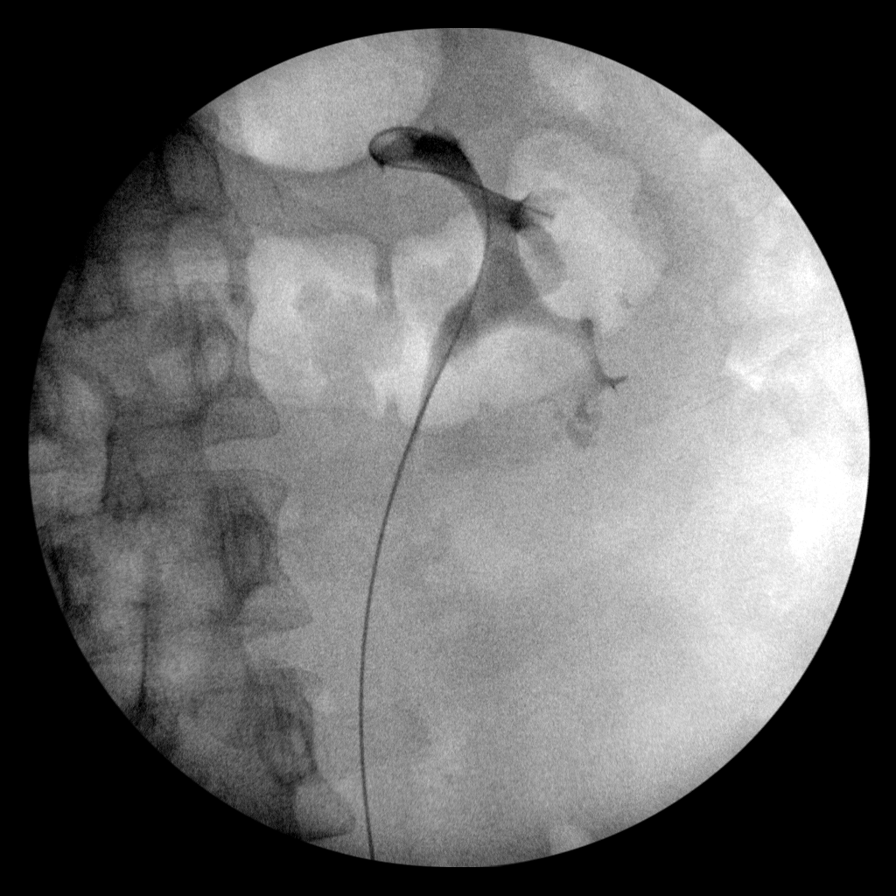
[im 3/7]
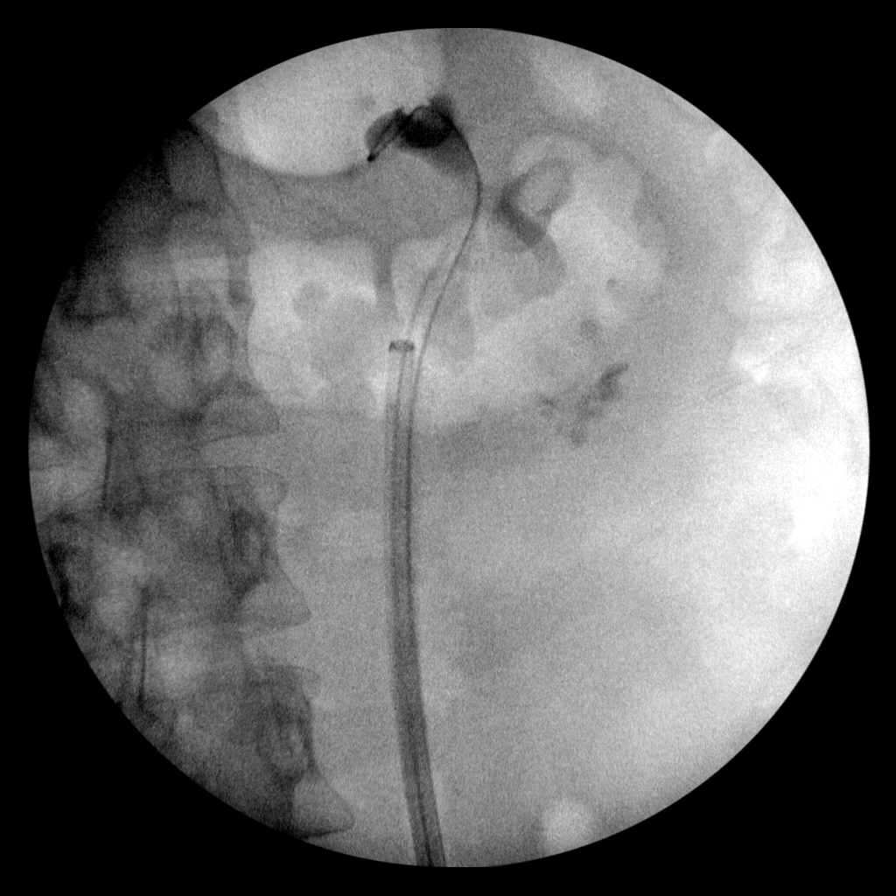
[im 4/7]
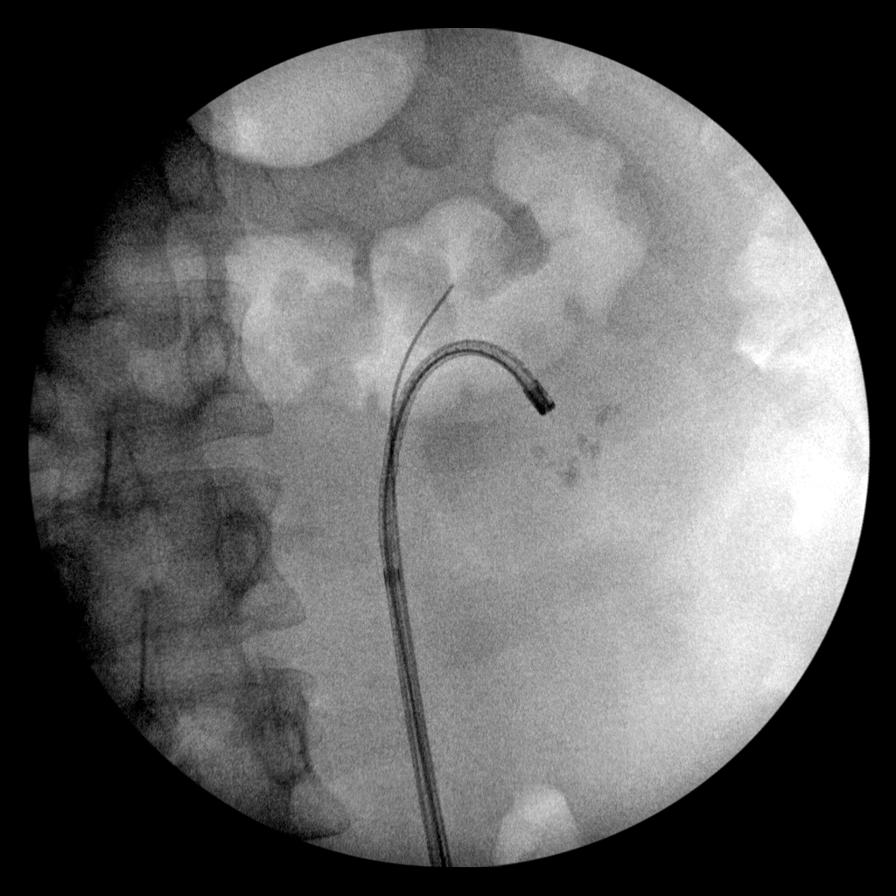
[im 5/7]
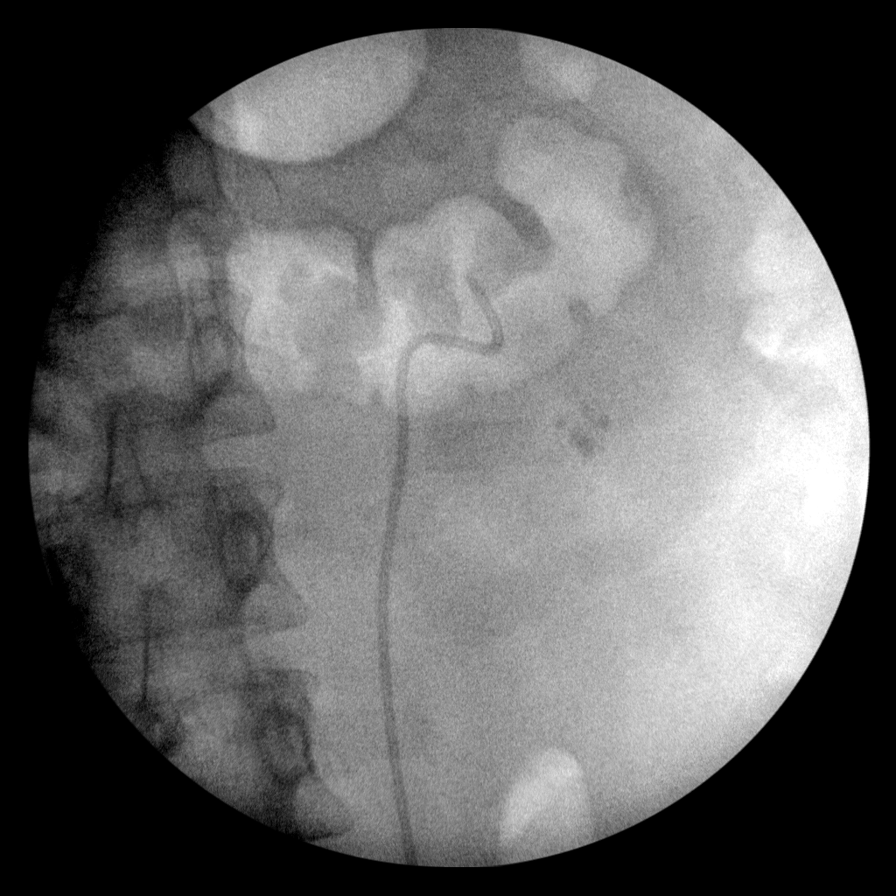
[im 6/7]
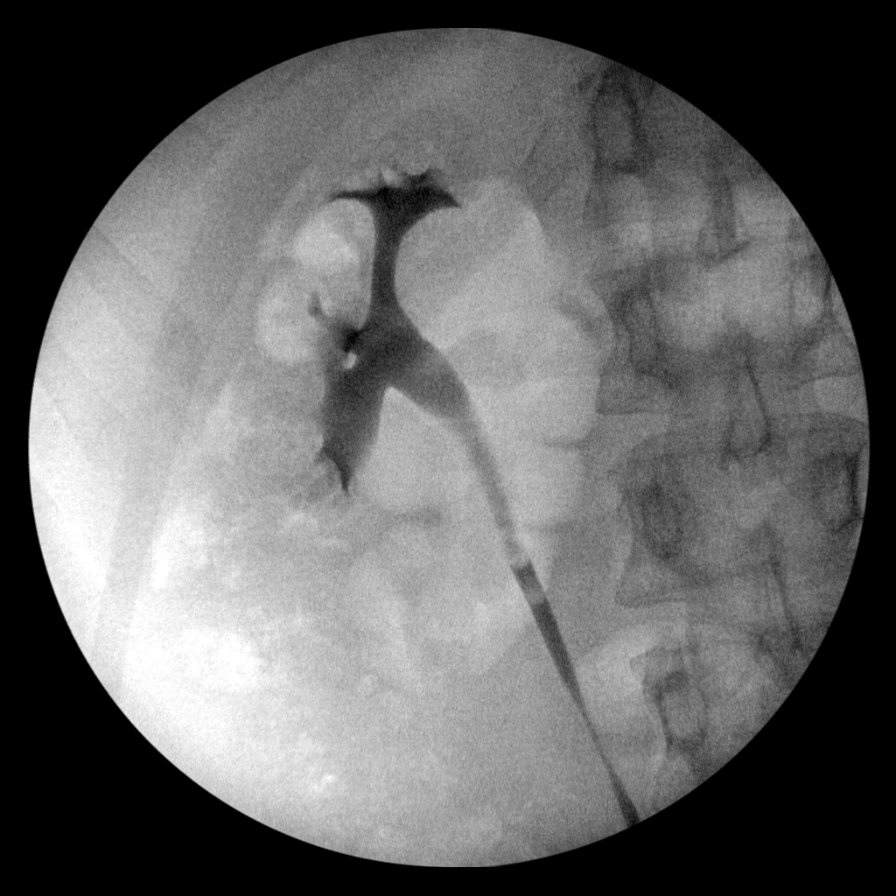
[im 7/7]
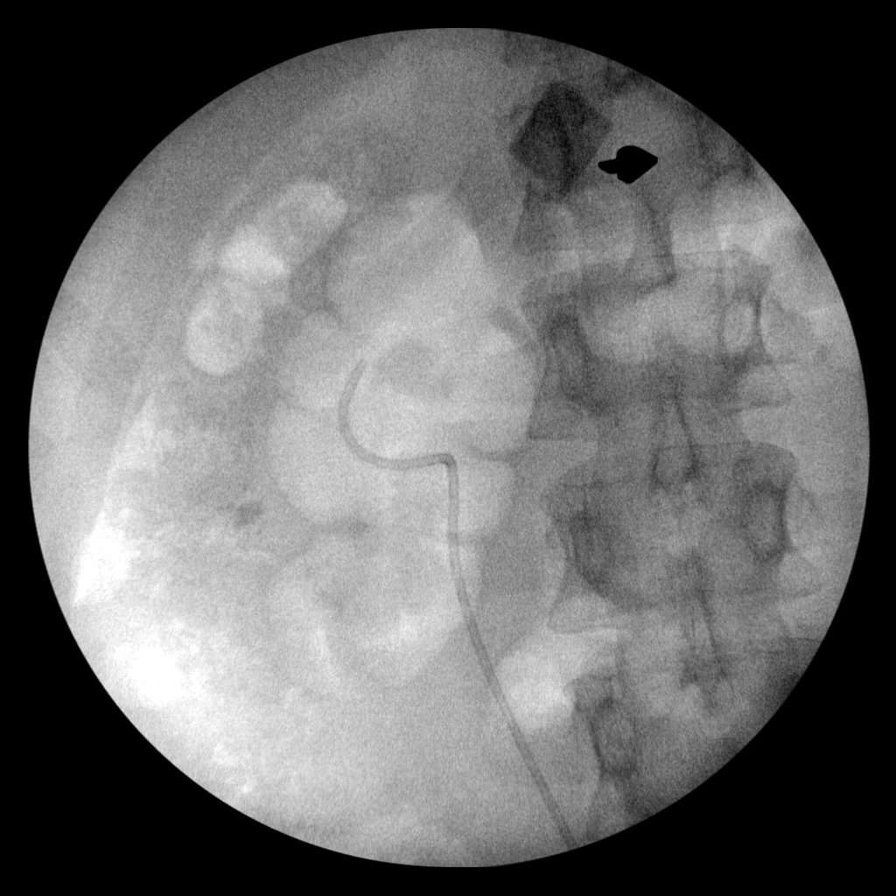

[7 of 7 positions shown; findings below may reference images not displayed]

FINDINGS: A series of fluoroscopic spot images, laterality not indicated. No
scout images submitted. Multiple small calculi project in the lower
pole of a renal collecting system. Retrograde pyelography performed,
distal ureter not visualized. Guidewire passage to the renal
collecting system with passage of a ureteral scope and ultimately
placement of a ureteral stent, distal aspect not visualized.
Subsequent images demonstrate contralateral retrograde pyelography,
limited, with ureteral stent placement, distal aspect not
visualized.
IMPRESSION: Nephrolithiasis with bilateral ureteral stent placement.

## 2018-01-01 ENCOUNTER — Encounter: Payer: Self-pay | Admitting: Family Medicine

## 2018-01-01 ENCOUNTER — Ambulatory Visit (INDEPENDENT_AMBULATORY_CARE_PROVIDER_SITE_OTHER): Payer: Managed Care, Other (non HMO) | Admitting: Family Medicine

## 2018-01-01 VITALS — BP 122/76 | Temp 98.5°F | Ht 65.0 in | Wt 193.0 lb

## 2018-01-01 DIAGNOSIS — J019 Acute sinusitis, unspecified: Secondary | ICD-10-CM | POA: Diagnosis not present

## 2018-01-01 MED ORDER — AMOXICILLIN-POT CLAVULANATE 875-125 MG PO TABS: 1.0000 | ORAL_TABLET | Freq: Two times a day (BID) | ORAL | 0 refills | Status: DC

## 2018-01-01 NOTE — Progress Notes (Signed)
   Subjective:    Patient ID: Stacey Wilson, female    DOB: May 08, 1980, 37 y.o.   MRN: [id number]  Sinusitis  This is a new problem. Episode onset: 2 days. Associated symptoms include congestion and coughing. Pertinent negatives include no ear pain or shortness of breath. (Runny nose, ear pain) Treatments tried: otc drugs.   Patient with significant head congestion drainage sinus pressure feeling very rundown because she is under a lot of stress at work as well as at home and helping take care of her grandmother   Review of Systems  Constitutional: Negative for activity change and fever.  HENT: Positive for congestion and rhinorrhea. Negative for ear pain.   Eyes: Negative for discharge.  Respiratory: Positive for cough. Negative for shortness of breath and wheezing.   Cardiovascular: Negative for chest pain.       Objective:   Physical Exam  Constitutional: She appears well-developed.  HENT:  Head: Normocephalic.  Nose: Nose normal.  Mouth/Throat: Oropharynx is clear and moist. No oropharyngeal exudate.  Neck: Neck supple.  Cardiovascular: Normal rate and normal heart sounds.  No murmur heard. Pulmonary/Chest: Effort normal and breath sounds normal. She has no wheezes.  Lymphadenopathy:    She has no cervical adenopathy.  Skin: Skin is warm and dry.  Nursing note and vitals reviewed.         Assessment & Plan:  Viral syndrome Secondary rhinosinusitis Augmentin twice daily 10 days Follow-up if progressive troubles or if worse

## 2018-04-26 ENCOUNTER — Ambulatory Visit (INDEPENDENT_AMBULATORY_CARE_PROVIDER_SITE_OTHER): Payer: Managed Care, Other (non HMO) | Admitting: Family Medicine

## 2018-04-26 ENCOUNTER — Encounter: Payer: Self-pay | Admitting: Family Medicine

## 2018-04-26 VITALS — BP 110/78 | Ht 65.0 in | Wt 194.0 lb

## 2018-04-26 DIAGNOSIS — F411 Generalized anxiety disorder: Secondary | ICD-10-CM

## 2018-04-26 DIAGNOSIS — B353 Tinea pedis: Secondary | ICD-10-CM

## 2018-04-26 DIAGNOSIS — Z13220 Encounter for screening for lipoid disorders: Secondary | ICD-10-CM | POA: Diagnosis not present

## 2018-04-26 DIAGNOSIS — Z131 Encounter for screening for diabetes mellitus: Secondary | ICD-10-CM | POA: Diagnosis not present

## 2018-04-26 DIAGNOSIS — F430 Acute stress reaction: Secondary | ICD-10-CM

## 2018-04-26 MED ORDER — TRIAMCINOLONE ACETONIDE 0.1 % EX CREA
1.0000 | TOPICAL_CREAM | Freq: Two times a day (BID) | CUTANEOUS | 2 refills | Status: DC
Start: 2018-04-26 — End: 2019-10-13

## 2018-04-26 MED ORDER — KETOCONAZOLE 2 % EX CREA: 1.0000 "application " | TOPICAL_CREAM | Freq: Two times a day (BID) | CUTANEOUS | 4 refills | Status: DC

## 2018-04-26 MED ORDER — CITALOPRAM HYDROBROMIDE 20 MG PO TABS: 20.0000 mg | ORAL_TABLET | Freq: Every day | ORAL | 1 refills | Status: DC

## 2018-04-26 MED ORDER — CLONAZEPAM 0.5 MG PO TABS: ORAL_TABLET | ORAL | 0 refills | Status: DC

## 2018-04-26 NOTE — Progress Notes (Signed)
   Subjective:    Patient ID: Stacey Wilson, female    DOB: 06/28/1980, 37 y.o.   MRN: [id number]  Anxiety  Presents for follow-up visit. Patient reports no chest pain, confusion, dizziness, nausea or shortness of breath.    taking klonopin and celexa. Pt states both are working well for her. No concerns today.  Patient relates medication doing well for her Stress levels doing well Overall under a lot of stress for things that are going on within her family   Review of Systems  Constitutional: Negative for activity change and appetite change.  HENT: Negative for congestion and rhinorrhea.   Respiratory: Negative for cough and shortness of breath.   Cardiovascular: Negative for chest pain and leg swelling.  Gastrointestinal: Negative for abdominal pain, nausea and vomiting.  Skin: Negative for color change.  Neurological: Negative for dizziness and weakness.  Psychiatric/Behavioral: Negative for agitation and confusion.       Objective:    No acute distress Cohesive and her thought pattern and in her speaking Lungs clear respiratory rate normal heart regular no murmurs GAD and PHQ 9 reviewed  Patient does have some mild eczema on her foot she also is requesting refills on tinea pedis as well as steroid cream proper ways to use these were discussed    Assessment & Plan:  Situational anxiety/stress/generalized anxiety disorder- doing well on medication continue Celexa daily.  Use Klonopin sparingly.  Recommend follow-up if progressive troubles or worse.  Otherwise follow-up in approximately 6 months.  Patient gets female health checkups through her gynecologist

## 2018-05-18 ENCOUNTER — Other Ambulatory Visit: Payer: Self-pay | Admitting: Adult Health

## 2018-06-23 ENCOUNTER — Encounter: Payer: Self-pay | Admitting: Family Medicine

## 2018-07-28 ENCOUNTER — Ambulatory Visit (INDEPENDENT_AMBULATORY_CARE_PROVIDER_SITE_OTHER): Payer: Managed Care, Other (non HMO) | Admitting: Family Medicine

## 2018-07-28 ENCOUNTER — Encounter: Payer: Self-pay | Admitting: Family Medicine

## 2018-07-28 ENCOUNTER — Other Ambulatory Visit: Payer: Self-pay

## 2018-07-28 ENCOUNTER — Other Ambulatory Visit: Payer: Self-pay | Admitting: Adult Health

## 2018-07-28 VITALS — Wt 198.0 lb

## 2018-07-28 DIAGNOSIS — J019 Acute sinusitis, unspecified: Secondary | ICD-10-CM

## 2018-07-28 MED ORDER — FLUTICASONE PROPIONATE 50 MCG/ACT NA SUSP: NASAL | 0 refills | Status: DC

## 2018-07-28 MED ORDER — CEFDINIR 300 MG PO CAPS: ORAL_CAPSULE | ORAL | 0 refills | Status: DC

## 2018-07-28 NOTE — Progress Notes (Signed)
   Subjective:    Patient ID: Stacey Wilson, female    DOB: 1981/02/05, 38 y.o.   MRN: [id number]  Sinusitis  This is a new problem. The current episode started in the past 7 days (started with allergies 3 days ago; when pt woke up this morning she felt bad). Associated symptoms include congestion, sinus pressure and sneezing. (Head congestion, facial pressure, itchy watery eyes) Treatments tried: claritin  The treatment provided mild relief.  pt would like work excuse for rest of week. Fax number for work is [phone]. Please put ATTN:BRANDY RICHARDSON  Virtual Visit via Telephone Note  I connected with Stacey Wilson on 07/28/18 at  1:40 PM EDT by telephone and verified that I am speaking with the correct person using two identifiers.   I discussed the limitations, risks, security and privacy concerns of performing an evaluation and management service by telephone and the availability of in person appointments. I also discussed with the patient that there may be a patient responsible charge related to this service. The patient expressed understanding and agreed to proceed.  Started with allergies  Worse now taking claritin  Hit hard eyes burning and nose running  No cough no sob  smoker History of Present Illness:    Observations/Objective:   Assessment and Plan:   Follow Up Instructions:    I discussed the assessment and treatment plan with the patient. The patient was provided an opportunity to ask questions and all were answered. The patient agreed with the plan and demonstrated an understanding of the instructions.   The patient was advised to call back or seek an in-person evaluation if the symptoms worsen or if the condition fails to improve as anticipated.  I provided minutes of non-face-to-face time during this encounter.   Marlowe Shores, LPN    Review of Systems  HENT: Positive for congestion, sinus pressure and sneezing.        Objective:   Physical Exam  Physical exam not performed      Assessment & Plan:  Impression probable allergic rhinitis with secondary rhinosinusitis.  Discussed.  Antibiotics prescribed.  On Claritin.  Flonase added.  Warning signs discussed.  Work excuse through Sunday as patient works as a Engineer, civil (consulting) in a Conservation officer, historic buildings at [employer].  Greater than 50% of this 15 minute face to face visit was spent in counseling and discussion and coordination of care regarding the above diagnosis/diagnosies

## 2018-07-28 NOTE — Addendum Note (Signed)
Addended by: Marlowe Shores on: 07/28/2018 01:51 PM   Modules accepted: Orders

## 2018-08-02 ENCOUNTER — Other Ambulatory Visit: Payer: Self-pay

## 2018-08-02 ENCOUNTER — Ambulatory Visit (INDEPENDENT_AMBULATORY_CARE_PROVIDER_SITE_OTHER): Payer: Managed Care, Other (non HMO) | Admitting: Family Medicine

## 2018-08-02 ENCOUNTER — Encounter: Payer: Self-pay | Admitting: Family Medicine

## 2018-08-02 DIAGNOSIS — R0989 Other specified symptoms and signs involving the circulatory and respiratory systems: Secondary | ICD-10-CM | POA: Diagnosis not present

## 2018-08-02 DIAGNOSIS — F419 Anxiety disorder, unspecified: Secondary | ICD-10-CM | POA: Diagnosis not present

## 2018-08-02 DIAGNOSIS — J019 Acute sinusitis, unspecified: Secondary | ICD-10-CM

## 2018-08-02 MED ORDER — AMOXICILLIN-POT CLAVULANATE 875-125 MG PO TABS: 1.0000 | ORAL_TABLET | Freq: Two times a day (BID) | ORAL | 0 refills | Status: DC

## 2018-08-02 MED ORDER — CITALOPRAM HYDROBROMIDE 20 MG PO TABS: 40.0000 mg | ORAL_TABLET | Freq: Every day | ORAL | 1 refills | Status: DC

## 2018-08-02 MED ORDER — CLONAZEPAM 0.5 MG PO TABS: 0.5000 mg | ORAL_TABLET | Freq: Two times a day (BID) | ORAL | 1 refills | Status: DC | PRN

## 2018-08-02 NOTE — Progress Notes (Signed)
   Subjective:    Patient ID: Stacey Wilson, female    DOB: 1980/10/30, 38 y.o.   MRN: [id number]  HPI  Virtual visit with video and audio patient at home I was present in the office  Patient was seen for a phone visit for sinus infection last week and given antibiotic and flonase and was doing better but now is noticing increase in SOB. Patient states she has a history of anxiety and is not sure if that is causing it. Ongoing troubles with head congestion drainage denies body aches denies fever sweats denies chest pressure tightness denies discolored foot She states when she moves about she does get short of breath she checks her pulse ox and it stays in 93-94 range when she is at rest it is at 94 and her heart rate is 80 when she is walking around it still in the 90s but heart rate jumps up to the 1 4150 range she denies any problems with cyanosis. Virtual Visit via Video Note  I connected with Stacey Wilson on 08/02/18 at  3:30 PM EDT by a video enabled telemedicine application and verified that I am speaking with the correct person using two identifiers.   I discussed the limitations of evaluation and management by telemedicine and the availability of in person appointments. The patient expressed understanding and agreed to proceed.  History of Present Illness:    Observations/Objective:   Assessment and Plan:   Follow Up Instructions:    I discussed the assessment and treatment plan with the patient. The patient was provided an opportunity to ask questions and all were answered. The patient agreed with the plan and demonstrated an understanding of the instructions.   The patient was advised to call back or seek an in-person evaluation if the symptoms worsen or if the condition fails to improve as anticipated.  I provided 15 minutes of non-face-to-face time during this encounter.     Review of Systems     Objective:   Physical Exam  Was able to do video visit was also able  to do consultation via phone as well because video have been verbal feedback      Assessment & Plan:  Probable sinus infection I really doubt that this is coronavirus we did discuss the importance of trying to minimize activity until getting better no work through this coming Sunday change antibiotic  Significant anxiety related to current coronavirus situation we did go ahead and increase citalopram to help her and refill the clonazepam that she can use when she is at home  Patient was encouraged to give [place] feedback in 48 hours how she is doing plus also that if she starts running high fevers severe shortness of breath seek ER evaluation

## 2018-08-03 ENCOUNTER — Encounter: Payer: Self-pay | Admitting: Family Medicine

## 2018-08-03 ENCOUNTER — Telehealth: Payer: Self-pay | Admitting: Family Medicine

## 2018-08-03 NOTE — Telephone Encounter (Signed)
I did do a letter.  Hopefully this will be good enough if not please let us know

## 2018-08-03 NOTE — Telephone Encounter (Signed)
Yes sir patient states that will be great. Her employer is requesting that symptoms of those be listed as well.   Thank you.

## 2018-08-03 NOTE — Telephone Encounter (Signed)
Please confirm with the patient that she would like to have the details regarding her sickness put in there that she was treated by [place] on 2 separate occasions for significant sinus infection and also respiratory illness and was recommended to be out of work through this coming Sunday.  If she needs more details than that please let me know.  Otherwise I can do a letter referencing the above

## 2018-08-03 NOTE — Telephone Encounter (Signed)
Work note was faxed to patients work but they are needing signs and symptoms of why she is out in order for her to be able to get paid.   Fax new note to [phone] attention Merry Proud

## 2018-08-19 ENCOUNTER — Other Ambulatory Visit: Payer: Self-pay | Admitting: Family Medicine

## 2018-08-24 ENCOUNTER — Other Ambulatory Visit: Payer: Self-pay | Admitting: Family Medicine

## 2018-09-03 DIAGNOSIS — Z029 Encounter for administrative examinations, unspecified: Secondary | ICD-10-CM

## 2018-09-16 ENCOUNTER — Telehealth: Payer: Self-pay | Admitting: Family Medicine

## 2018-09-16 NOTE — Telephone Encounter (Signed)
Patient brought in disability claim form to be filled out finished my part but need help with the highlighted areas please.Please review ,date,sign in your box.

## 2018-09-20 NOTE — Telephone Encounter (Signed)
I filled this out to the best of our ability please finish and forward to the patient thank you

## 2018-09-23 ENCOUNTER — Other Ambulatory Visit: Payer: Self-pay | Admitting: Family Medicine

## 2018-09-23 ENCOUNTER — Telehealth: Payer: Self-pay | Admitting: Family Medicine

## 2018-09-23 NOTE — Telephone Encounter (Signed)
Patient left message on 5/27 checking on form for short term disability. Please advise no copy was made if sent upfront. She is needing by tomorrow or her claim will be dropped.

## 2018-09-24 NOTE — Telephone Encounter (Signed)
Stacey Wilson stated that she was able to fax And mail a copy And she will notify the patient Should the patient need anything additional to please let us know We certainly appreciate the hard work

## 2018-10-07 ENCOUNTER — Other Ambulatory Visit: Payer: Self-pay | Admitting: Family Medicine

## 2018-10-07 MED ORDER — FLUTICASONE PROPIONATE 50 MCG/ACT NA SUSP: NASAL | 1 refills | Status: DC

## 2018-10-11 ENCOUNTER — Other Ambulatory Visit: Payer: Self-pay | Admitting: Adult Health

## 2018-10-25 ENCOUNTER — Ambulatory Visit: Payer: Managed Care, Other (non HMO) | Admitting: Family Medicine

## 2018-10-31 ENCOUNTER — Other Ambulatory Visit: Payer: Self-pay | Admitting: Adult Health

## 2018-11-01 ENCOUNTER — Telehealth: Payer: Self-pay | Admitting: Family Medicine

## 2018-11-01 ENCOUNTER — Telehealth: Payer: Self-pay | Admitting: *Deleted

## 2018-11-01 NOTE — Telephone Encounter (Signed)
Message on pt earlier and dr scott [place] covid [place]. Pt states she just did testing at her job [employer] and called testing site to cancel appt.

## 2018-11-01 NOTE — Telephone Encounter (Signed)
Left message to return call 

## 2018-11-01 NOTE — Telephone Encounter (Signed)
Pt states that she needs [place] to call the test site across from [facility]. She needs her 2nd test done to return to work. She tested positive at her job 2 weeks ago before vacation and went to [employer] last week she tested Monday and was negative but her work is requiring her to have a 2nd test.

## 2018-11-01 NOTE — Telephone Encounter (Signed)
Message sent to testing site and pt was notified.

## 2018-11-01 NOTE — Telephone Encounter (Signed)
Thank you :)

## 2018-11-01 NOTE — Telephone Encounter (Signed)
Go ahead and order the test please

## 2018-11-01 NOTE — Telephone Encounter (Signed)
Pt states she works at [employer] and had covid 19 test on the 24th was positive. Second test was on the 29th and was negative and pt states her job is requiring her to have a second test done. She called testing site but was told she needed an order from doctor. Would like to go to the one across from [facility]. Pt states she has had no symptoms the whole time.

## 2018-11-01 NOTE — Telephone Encounter (Signed)
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  She states she is supposed to have 2 negative tests before returning to work, and will call her doctor's office to schedule if she needs to get the 2nd test at one of the [facility] testing sites.

## 2018-11-01 NOTE — Telephone Encounter (Signed)
Pt needs covid 19 testing. Please call pt and set up appointment. thanks

## 2019-02-02 ENCOUNTER — Other Ambulatory Visit: Payer: Self-pay | Admitting: Adult Health

## 2019-03-29 ENCOUNTER — Other Ambulatory Visit: Payer: Self-pay

## 2019-03-29 DIAGNOSIS — Z20822 Contact with and (suspected) exposure to covid-19: Secondary | ICD-10-CM

## 2019-03-31 LAB — NOVEL CORONAVIRUS, NAA: SARS-CoV-2, NAA: NOT DETECTED

## 2019-04-06 ENCOUNTER — Ambulatory Visit (INDEPENDENT_AMBULATORY_CARE_PROVIDER_SITE_OTHER): Payer: Managed Care, Other (non HMO) | Admitting: Family Medicine

## 2019-04-06 ENCOUNTER — Other Ambulatory Visit: Payer: Self-pay

## 2019-04-06 DIAGNOSIS — M25512 Pain in left shoulder: Secondary | ICD-10-CM

## 2019-04-06 DIAGNOSIS — M79602 Pain in left arm: Secondary | ICD-10-CM

## 2019-04-06 DIAGNOSIS — S46912A Strain of unspecified muscle, fascia and tendon at shoulder and upper arm level, left arm, initial encounter: Secondary | ICD-10-CM

## 2019-04-06 MED ORDER — TIZANIDINE HCL 4 MG PO TABS: ORAL_TABLET | ORAL | 0 refills | Status: DC

## 2019-04-06 MED ORDER — PREDNISONE 20 MG PO TABS: ORAL_TABLET | ORAL | 0 refills | Status: DC

## 2019-04-06 NOTE — Progress Notes (Signed)
   Subjective:    Patient ID: Moriah Shawley, female    DOB: 20-Jun-1980, 38 y.o.   MRN: [id number]  Shoulder Pain  The pain is present in the left shoulder and neck. This is a recurrent problem. Episode onset: worse yesterday  Quality: stabbing/shooting pain down arm, constant stab in shoulder, left arm is heavy  The pain is at a severity of 8/10 (bending head down, her neck hurts bad ). She has tried cold, heat and NSAIDS (exercises ) for the symptoms. Improvement on treatment: ice does best; other treatments not much relief   left arm pain for about 2 weeks. In between shoulder blades, down left arm and in neck  Virtual Visit via Telephone Note  I connected with Amadeo Garnet on 04/06/19 at  3:00 PM EST by telephone and verified that I am speaking with the correct person using two identifiers.  Location: Patient: home Provider: office   I discussed the limitations, risks, security and privacy concerns of performing an evaluation and management service by telephone and the availability of in person appointments. I also discussed with the patient that there may be a patient responsible charge related to this service. The patient expressed understanding and agreed to proceed.   History of Present Illness:    Observations/Objective:   Assessment and Plan:   Follow Up Instructions:    I discussed the assessment and treatment plan with the patient. The patient was provided an opportunity to ask questions and all were answered. The patient agreed with the plan and demonstrated an understanding of the instructions.   The patient was advised to call back or seek an in-person evaluation if the symptoms worsen or if the condition fails to improve as anticipated.  I provided24minutes of non-face-to-face time during this encounter.  Similar to before  Feels like a crick in the neck   Pain in left arm and post shoulf and rad t o neck and down into the arm  Vicente Males, LPN   Review  of Systems See above    Objective:   Physical Exam  Virtual      Assessment & Plan:  Paracervical supraclavicular parascapular strain.  Discussed at length.  Imaging very unlikely to show any benefit.  Prednisone taper for anti-inflammatory purposes.  Zanaflex as needed symptom care discussed expect slow resolution stretching encouraged

## 2019-04-07 ENCOUNTER — Encounter: Payer: Self-pay | Admitting: Family Medicine

## 2019-04-26 ENCOUNTER — Ambulatory Visit (INDEPENDENT_AMBULATORY_CARE_PROVIDER_SITE_OTHER): Payer: Managed Care, Other (non HMO) | Admitting: Family Medicine

## 2019-04-26 ENCOUNTER — Other Ambulatory Visit: Payer: Self-pay

## 2019-04-26 ENCOUNTER — Encounter: Payer: Self-pay | Admitting: Family Medicine

## 2019-04-26 DIAGNOSIS — J189 Pneumonia, unspecified organism: Secondary | ICD-10-CM | POA: Diagnosis not present

## 2019-04-26 MED ORDER — AZITHROMYCIN 250 MG PO TABS: ORAL_TABLET | ORAL | 0 refills | Status: DC

## 2019-04-26 MED ORDER — HYDROCODONE-HOMATROPINE 5-1.5 MG/5ML PO SYRP: 5.0000 mL | ORAL_SOLUTION | Freq: Four times a day (QID) | ORAL | 0 refills | Status: AC | PRN

## 2019-04-26 NOTE — Progress Notes (Signed)
   Subjective:    Patient ID: Stacey Wilson, female    DOB: [birth year], 38 y.o.   MRN: [id number]  Cough This is a new problem. Episode onset: last week. Associated symptoms include rhinorrhea. Pertinent negatives include no chest pain, ear pain, fever, shortness of breath or wheezing.  started feeling bad last week. Headache and jaw pain. Got hoarse and then developed a cough. No fever, sometimes she does feel she gets sob. She test twice a week at work for covid but due to the holidays the last test was 9 days ago Sunday. Would be testing tomorrow but she is not going to work. Daughter was sick for a month with a cough and had a negative covid test. Information to covid testing site given but pt states she does not feel like going today. I talked with the patient at length she denies wheezing or difficulty breathing but does relate some coughing.  She denies high fever chills. Virtual Visit via Telephone Note  I connected with Neesha Langton on 04/26/19 at  3:50 PM EST by telephone and verified that I am speaking with the correct person using two identifiers.  Location: Patient: home Provider: office   I discussed the limitations, risks, security and privacy concerns of performing an evaluation and management service by telephone and the availability of in person appointments. I also discussed with the patient that there may be a patient responsible charge related to this service. The patient expressed understanding and agreed to proceed.   History of Present Illness:    Observations/Objective:   Assessment and Plan:   Follow Up Instructions:    I discussed the assessment and treatment plan with the patient. The patient was provided an opportunity to ask questions and all were answered. The patient agreed with the plan and demonstrated an understanding of the instructions.   The patient was advised to call back or seek an in-person evaluation if the symptoms worsen or if the condition  fails to improve as anticipated.  I provided 16 minutes of non-face-to-face time during this encounter.        Review of Systems  Constitutional: Negative for activity change and fever.  HENT: Positive for congestion and rhinorrhea. Negative for ear pain.   Eyes: Negative for discharge.  Respiratory: Positive for cough. Negative for shortness of breath and wheezing.   Cardiovascular: Negative for chest pain.       Objective:   Physical Exam  Today's visit was via telephone Physical exam was not possible for this visit       Assessment & Plan:  Strong possibility of underlying community-acquired pneumonia She should not work through the weekend Zithromax as directed Chest x-ray offered patient would like to hold off for right now Her O2 sats around 95% she was instructed if it drops into the 80s to immediately go to the ER Hycodan as needed at nighttime for cough caution drowsiness Patient was encouraged to go get Covid testing She will look at doing so on Wednesday We also offered to take a listen to her today but she states she does not feel up to getting out of the house and would prefer not to She will call us back if any progressive troubles

## 2019-04-30 ENCOUNTER — Other Ambulatory Visit: Payer: Self-pay | Admitting: Adult Health

## 2019-05-04 ENCOUNTER — Ambulatory Visit (INDEPENDENT_AMBULATORY_CARE_PROVIDER_SITE_OTHER): Payer: Managed Care, Other (non HMO) | Admitting: Family Medicine

## 2019-05-04 ENCOUNTER — Encounter: Payer: Self-pay | Admitting: Family Medicine

## 2019-05-04 DIAGNOSIS — J019 Acute sinusitis, unspecified: Secondary | ICD-10-CM | POA: Diagnosis not present

## 2019-05-04 MED ORDER — AMOXICILLIN-POT CLAVULANATE 875-125 MG PO TABS: 1.0000 | ORAL_TABLET | Freq: Two times a day (BID) | ORAL | 0 refills | Status: DC

## 2019-05-04 NOTE — Progress Notes (Signed)
   Subjective:    Patient ID: Stacey Wilson, female    DOB: 1980/12/19, 39 y.o.   MRN: [id number]  HPI  Patient calls for a follow up on pneumonia. Patent states she is still having a lot of congestion and cough. Patiet states she has also developed severe fatigue and body aches and thinks she needs more antibiotics. Patient presents with ongoing congestion coughing sinus pressure pain discomfort some intermittent ear pain she gets tested at work for [employer] and has had a negative test just several days ago which was rapid test we did discuss how sometimes rapid test can have a false negative patient aware of this.  She states she gets fatigued with activity but denies any shortness of breath at rest.  PMH benign patient staying away from smoking for now Virtual Visit via Video Note  I connected with Stacey Wilson on 05/04/19 at 10:00 AM EST by a video enabled telemedicine application and verified that I am speaking with the correct person using two identifiers.  Location: Patient: home Provider: office   I discussed the limitations of evaluation and management by telemedicine and the availability of in person appointments. The patient expressed understanding and agreed to proceed.  History of Present Illness:    Observations/Objective:   Assessment and Plan:   Follow Up Instructions:    I discussed the assessment and treatment plan with the patient. The patient was provided an opportunity to ask questions and all were answered. The patient agreed with the plan and demonstrated an understanding of the instructions.   The patient was advised to call back or seek an in-person evaluation if the symptoms worsen or if the condition fails to improve as anticipated.  I provided 17 minutes of non-face-to-face time during this encounter.       Review of Systems  Constitutional: Negative for activity change and fever.  HENT: Positive for congestion, ear pain and rhinorrhea.   Eyes:  Negative for discharge.  Respiratory: Positive for cough. Negative for shortness of breath and wheezing.   Cardiovascular: Negative for chest pain.       Objective:   Physical Exam Today's visit was via telephone Physical exam was not possible for this visit        Assessment & Plan:  Ongoing acute rhinosinusitis Possible ear infection Possible underlying Covid infection Encourage patient to consider getting PCR Covid testing No way patient can work currently I recommend she stay on for the next at least 5 days potentially longer depending on how her symptoms go new round of antibiotic sent and warning signs discussed patient encouraged to stay at home and away from others.

## 2019-07-22 ENCOUNTER — Other Ambulatory Visit: Payer: Self-pay | Admitting: Family Medicine

## 2019-07-22 ENCOUNTER — Other Ambulatory Visit: Payer: Self-pay | Admitting: Adult Health

## 2019-07-22 MED ORDER — CLONAZEPAM 0.5 MG PO TABS: ORAL_TABLET | ORAL | 0 refills | Status: DC

## 2019-07-22 MED ORDER — FLUTICASONE PROPIONATE 50 MCG/ACT NA SUSP: NASAL | 1 refills | Status: DC

## 2019-07-22 NOTE — Telephone Encounter (Signed)
Patient would like a refill on Flonase and Klonopin sent in to [facility] in [place].

## 2019-07-22 NOTE — Telephone Encounter (Signed)
May have 6 Flonase refills 1 Klonopin refill Needs to schedule a virtual visit follow-up or in person follow-up for additional refills Please pend Klonopin then send back to me thank you

## 2019-08-14 ENCOUNTER — Other Ambulatory Visit: Payer: Self-pay | Admitting: Adult Health

## 2019-09-11 ENCOUNTER — Other Ambulatory Visit: Payer: Self-pay | Admitting: Adult Health

## 2019-10-05 ENCOUNTER — Other Ambulatory Visit: Payer: Self-pay | Admitting: Adult Health

## 2019-10-13 ENCOUNTER — Other Ambulatory Visit (HOSPITAL_COMMUNITY)
Admission: RE | Admit: 2019-10-13 | Discharge: 2019-10-13 | Disposition: A | Discharge status: Home / Self Care | Payer: Managed Care, Other (non HMO) | Source: Ambulatory Visit | Attending: Adult Health | Admitting: Adult Health

## 2019-10-13 ENCOUNTER — Ambulatory Visit (INDEPENDENT_AMBULATORY_CARE_PROVIDER_SITE_OTHER): Payer: Managed Care, Other (non HMO) | Admitting: Adult Health

## 2019-10-13 ENCOUNTER — Encounter: Payer: Self-pay | Admitting: Adult Health

## 2019-10-13 VITALS — BP 125/85 | HR 76 | Ht 65.0 in | Wt 202.0 lb

## 2019-10-13 DIAGNOSIS — R7989 Other specified abnormal findings of blood chemistry: Secondary | ICD-10-CM

## 2019-10-13 DIAGNOSIS — Z01419 Encounter for gynecological examination (general) (routine) without abnormal findings: Secondary | ICD-10-CM | POA: Insufficient documentation

## 2019-10-13 DIAGNOSIS — Z3041 Encounter for surveillance of contraceptive pills: Secondary | ICD-10-CM | POA: Diagnosis not present

## 2019-10-13 DIAGNOSIS — M542 Cervicalgia: Secondary | ICD-10-CM

## 2019-10-13 DIAGNOSIS — N6489 Other specified disorders of breast: Secondary | ICD-10-CM | POA: Diagnosis not present

## 2019-10-13 DIAGNOSIS — N62 Hypertrophy of breast: Secondary | ICD-10-CM | POA: Diagnosis not present

## 2019-10-13 DIAGNOSIS — M25519 Pain in unspecified shoulder: Secondary | ICD-10-CM | POA: Insufficient documentation

## 2019-10-13 MED ORDER — NORETHINDRONE 0.35 MG PO TABS: 1.0000 | ORAL_TABLET | Freq: Every day | ORAL | 4 refills | Status: DC

## 2019-10-13 NOTE — Progress Notes (Signed)
Patient ID: Stacey Wilson, female   DOB: 24-Jan-1981, 39 y.o.   MRN: [id number] History of Present Illness:  Stacey Wilson is a 39 year old white female, married, G1P1, in for a well woman gyn exam and pap. She works nights at [employer], 3 12 hour shifts per week, did not get COVID and has not had vaccine, all her test negative.  PCP is [facility].  Current Medications, Allergies, Past Medical History, Past Surgical History, Family History and Social History were reviewed in [name] record.     Review of Systems: Patient denies any headaches, hearing loss, fatigue, blurred vision, shortness of breath, chest pain, abdominal pain, problems with bowel movements, urination, or intercourse. No joint pain or mood swings. Has noticed that underarms seem swollen since about February, noticed in the shower, no pain.change deodorants first  She is happy with Micronor.  She voiced interest in having breasts reduced, L>R, wears 40DD to fit left breast and neck and shoulders hurt and has shoulder indents    Physical Exam:BP 125/85 (BP Location: Right Arm, Patient Position: Sitting, Cuff Size: Large)   Pulse 76   Ht 5\' 5"  (1.651 m)   Wt 202 lb (91.6 kg)   BMI 33.61 kg/m  General:  Well developed, well nourished, no acute distress Skin:  Warm and dry Neck:  Midline trachea, normal thyroid, good ROM, no lymphadenopathy Lungs; Clear to auscultation bilaterally Breast:  No dominant palpable mass, retraction, or nipple discharge,breasts are large, L>R, has some prominence underarms, no masses felt . Has indents on shoulders  Cardiovascular: Regular rate and rhythm Abdomen:  Soft, non tender, no hepatosplenomegaly Pelvic:  External genitalia is normal in appearance, no lesions.  The vagina is normal in appearance. Urethra has no lesions or masses. The cervix is bulbous.Pap with high risk HPV 16/18 genotyping and GC/CHL performed.  Uterus is felt to be normal size, shape, and contour.   No adnexal masses or tenderness noted.Bladder is non tender, no masses felt. Extremities/musculoskeletal:  No swelling or varicosities noted, no clubbing or cyanosis Psych:  No mood changes, alert and cooperative,seems happy AA is 5, counseled to decrease alcohol Fall risk is low PHQ 9 score is 0. She is not ready to quit smoking.  Examination chaperoned by LPN.  Impression and Plan: 1. Encounter for gynecological examination with Papanicolaou smear of cervix Pap sent Physical in 1 year Pap in 3 if normal Check CBC,CMP,TSH and lipids Mammogram at 42  2. Encounter for surveillance of contraceptive pills Continue Micronor Meds ordered this encounter  Medications  . norethindrone (MICRONOR) 0.35 MG tablet    Sig: Take 1 tablet (0.35 mg total) by mouth daily.    Dispense:  84 tablet    Refill:  4    Order Specific Question:   Supervising Provider    Answer:   Malachy Mood, LUTHER H [[id number]]    3. Large breasts # given for Dr Despina Hidden for consult, could reduce left and do lift on both. Will recheck under arms in 2 months   4. Breast asymmetry in female Talk with Dr Shon Hough   5. Neck and shoulder pain Talk with Dr Shon Hough

## 2019-10-17 LAB — CYTOLOGY - PAP
Chlamydia: NEGATIVE
Comment: NEGATIVE
Comment: NEGATIVE
Comment: NORMAL
Diagnosis: NEGATIVE
High risk HPV: NEGATIVE
Neisseria Gonorrhea: NEGATIVE

## 2019-10-17 NOTE — Addendum Note (Signed)
Addended by: Cyril Mourning A on: 10/17/2019 08:40 AM   Modules accepted: Orders

## 2019-10-25 ENCOUNTER — Encounter: Payer: Self-pay | Admitting: Family Medicine

## 2019-11-29 ENCOUNTER — Encounter: Payer: Self-pay | Admitting: Family Medicine

## 2019-11-29 ENCOUNTER — Telehealth: Payer: Self-pay | Admitting: Family Medicine

## 2019-11-29 DIAGNOSIS — R198 Other specified symptoms and signs involving the digestive system and abdomen: Secondary | ICD-10-CM

## 2019-11-29 DIAGNOSIS — Z79899 Other long term (current) drug therapy: Secondary | ICD-10-CM

## 2019-11-29 NOTE — Telephone Encounter (Signed)
Patient called because she had one "white" bowel movement and wanted to know if this was normal?

## 2019-11-29 NOTE — Telephone Encounter (Signed)
Left message to return call 

## 2019-11-29 NOTE — Telephone Encounter (Signed)
Pt returned call.  Pt states it was more of a light clay colored BM. Soft, long and skinny BM. Just once last night.  Yesterday morning stomach began burning bad, gas, upset stomach, diarrhea up until last night.  Stomach is still bubbling and upset. Has not been eating much. Feels weak. No blood in bowel movement. No fever. Please advise. Thank you  (Pt states that Cyril Mourning had her do blood work recently.)

## 2019-11-29 NOTE — Telephone Encounter (Signed)
Lab orders placed. Left voicemail to return call

## 2019-11-29 NOTE — Telephone Encounter (Signed)
I would recommend CBC, liver, metabolic 7, lipase I also recommend an office visit later this week with either myself or Clydie Braun

## 2019-11-30 NOTE — Telephone Encounter (Signed)
Patient notified and scheduled office visit with Clydie Braun 12/02/2019

## 2019-12-01 LAB — BASIC METABOLIC PANEL
BUN/Creatinine Ratio: 17 (ref 9–23)
BUN: 12 mg/dL (ref 6–20)
CO2: 22 mmol/L (ref 20–29)
Calcium: 9.5 mg/dL (ref 8.7–10.2)
Chloride: 102 mmol/L (ref 96–106)
Creatinine, Ser: 0.69 mg/dL (ref 0.57–1.00)
GFR calc Af Amer: 129 mL/min/{1.73_m2} (ref >59)
GFR calc non Af Amer: 112 mL/min/{1.73_m2} (ref >59)
Glucose: 90 mg/dL (ref 65–99)
Potassium: 4.2 mmol/L (ref 3.5–5.2)
Sodium: 138 mmol/L (ref 134–144)

## 2019-12-01 LAB — CBC WITH DIFFERENTIAL/PLATELET
Basophils Absolute: 0 10*3/uL (ref 0.0–0.2)
Basos: 1 %
EOS (ABSOLUTE): 0.1 10*3/uL (ref 0.0–0.4)
Eos: 1 %
Hematocrit: 41.9 % (ref 34.0–46.6)
Hemoglobin: 13.9 g/dL (ref 11.1–15.9)
Immature Grans (Abs): 0 10*3/uL (ref 0.0–0.1)
Immature Granulocytes: 1 %
Lymphocytes Absolute: 2.7 10*3/uL (ref 0.7–3.1)
Lymphs: 35 %
MCH: 30.3 pg (ref 26.6–33.0)
MCHC: 33.2 g/dL (ref 31.5–35.7)
MCV: 92 fL (ref 79–97)
Monocytes Absolute: 0.8 10*3/uL (ref 0.1–0.9)
Monocytes: 10 %
Neutrophils Absolute: 4 10*3/uL (ref 1.4–7.0)
Neutrophils: 52 %
Platelets: 248 10*3/uL (ref 150–450)
RBC: 4.58 x10E6/uL (ref 3.77–5.28)
RDW: 12.3 % (ref 11.7–15.4)
WBC: 7.6 10*3/uL (ref 3.4–10.8)

## 2019-12-01 LAB — HEPATIC FUNCTION PANEL
ALT: 17 IU/L (ref 0–32)
AST: 12 IU/L (ref 0–40)
Albumin: 4.4 g/dL (ref 3.8–4.8)
Alkaline Phosphatase: 57 IU/L (ref 48–121)
Bilirubin Total: 0.3 mg/dL (ref 0.0–1.2)
Bilirubin, Direct: 0.1 mg/dL (ref 0.00–0.40)
Total Protein: 6.9 g/dL (ref 6.0–8.5)

## 2019-12-01 LAB — LIPASE: Lipase: 17 U/L (ref 14–72)

## 2019-12-02 ENCOUNTER — Other Ambulatory Visit: Payer: Self-pay

## 2019-12-02 ENCOUNTER — Encounter: Payer: Self-pay | Admitting: Family Medicine

## 2019-12-02 ENCOUNTER — Ambulatory Visit: Payer: Managed Care, Other (non HMO) | Admitting: Family Medicine

## 2019-12-02 VITALS — BP 118/72 | HR 98 | Temp 97.8°F | Ht 65.0 in | Wt 204.0 lb

## 2019-12-02 DIAGNOSIS — R198 Other specified symptoms and signs involving the digestive system and abdomen: Secondary | ICD-10-CM

## 2019-12-02 DIAGNOSIS — R109 Unspecified abdominal pain: Secondary | ICD-10-CM

## 2019-12-02 NOTE — Patient Instructions (Signed)
On 8/17 take stool sample to [facility]. We will notify you of results once available. Referral for GI specialist will be sent to evaluate for diarrhea/constipation/abdominal pain that is relieved by defecation.   Enjoy your vacation!!!   Diet for Irritable Bowel Syndrome When you have irritable bowel syndrome (IBS), it is very important to eat the foods and follow the eating habits that are best for your condition. IBS may cause various symptoms such as pain in the abdomen, constipation, or diarrhea. Choosing the right foods can help to ease the discomfort from these symptoms. Work with your health care provider and diet and nutrition specialist (dietitian) to find the eating plan that will help to control your symptoms. What are tips for following this plan?      Keep a food diary. This will help you identify foods that cause symptoms. Write down: ? What you eat and when you eat it. ? What symptoms you have. ? When symptoms occur in relation to your meals, such as "pain in abdomen 2 hours after dinner."  Eat your meals slowly and in a relaxed setting.  Aim to eat 5-6 small meals per day. Do not skip meals.  Drink enough fluid to keep your urine pale yellow.  Ask your health care provider if you should take an over-the-counter probiotic to help restore healthy bacteria in your gut (digestive tract). ? Probiotics are foods that contain good bacteria and yeasts.  Your dietitian may have specific dietary recommendations for you based on your symptoms. He or she may recommend that you: ? Avoid foods that cause symptoms. Talk with your dietitian about other ways to get the same nutrients that are in those problem foods. ? Avoid foods with gluten. Gluten is a protein that is found in rye, wheat, and barley. ? Eat more foods that contain soluble fiber. Examples of foods with high soluble fiber include oats, seeds, and certain fruits and vegetables. Take a fiber supplement if directed by your  dietitian. ? Reduce or avoid certain foods called FODMAPs. These are foods that contain carbohydrates that are hard to digest. Ask your doctor which foods contain these carbohydrates. What foods are not recommended? The following are some foods and drinks that may make your symptoms worse:  Fatty foods, such as french fries.  Foods that contain gluten, such as pasta and cereal.  Dairy products, such as milk, cheese, and ice cream.  Chocolate.  Alcohol.  Products with caffeine, such as coffee.  Carbonated drinks, such as soda.  Foods that are high in FODMAPs. These include certain fruits and vegetables.  Products with sweeteners such as honey, high fructose corn syrup, sorbitol, and mannitol. The items listed above may not be a complete list of foods and beverages you should avoid. Contact a dietitian for more information. What foods are good sources of fiber? Your health care provider or dietitian may recommend that you eat more foods that contain fiber. Fiber can help to reduce constipation and other IBS symptoms. Add foods with fiber to your diet a little at a time so your body can get used to them. Too much fiber at one time might cause gas and swelling of your abdomen. The following are some foods that are good sources of fiber:  Berries, such as raspberries, strawberries, and blueberries.  Tomatoes.  Carrots.  Brown rice.  Oats.  Seeds, such as chia and pumpkin seeds. The items listed above may not be a complete list of recommended sources of fiber. Contact your  dietitian for more options. Where to find more information  International Foundation for Functional Gastrointestinal Disorders: www.iffgd.[employer] of Diabetes and Digestive and Kidney Diseases: CarFli[employer].tn Summary  When you have irritable bowel syndrome (IBS), it is very important to eat the foods and follow the eating habits that are best for your condition.  IBS may cause various  symptoms such as pain in the abdomen, constipation, or diarrhea.  Choosing the right foods can help to ease the discomfort that comes from symptoms.  Keep a food diary. This will help you identify foods that cause symptoms.  Your health care provider or diet and nutrition specialist (dietitian) may recommend that you eat more foods that contain fiber. This information is not intended to replace advice given to you by your health care provider. Make sure you discuss any questions you have with your health care provider. Document Revised: 08/04/2018 Document Reviewed: 12/16/2016 Elsevier Patient Education  2020 Elsevier Inc.  Irritable Bowel Syndrome, Adult  Irritable bowel syndrome (IBS) is a group of symptoms that affects the organs responsible for digestion (gastrointestinal or GI tract). IBS is not one specific disease. To regulate how the GI tract works, the body sends signals back and forth between the intestines and the brain. If you have IBS, there may be a problem with these signals. As a result, the GI tract does not function normally. The intestines may become more sensitive and overreact to certain things. This may be especially true when you eat certain foods or when you are under stress. There are four types of IBS. These may be determined based on the consistency of your stool (feces):  IBS with diarrhea.  IBS with constipation.  Mixed IBS.  Unsubtyped IBS. It is important to know which type of IBS you have. Certain treatments are more likely to be helpful for certain types of IBS. What are the causes? The exact cause of IBS is not known. What increases the risk? You may have a higher risk for IBS if you:  Are female.  Are younger than 66.  Have a family history of IBS.  Have a mental health condition, such as depression, anxiety, or post-traumatic stress disorder.  Have had a bacterial infection of your GI tract. What are the signs or symptoms? Symptoms of IBS  vary from person to person. The main symptom is abdominal pain or discomfort. Other symptoms usually include one or more of the following:  Diarrhea, constipation, or both.  Abdominal swelling or bloating.  Feeling full after eating a small or regular-sized meal.  Frequent gas.  Mucus in the stool.  A feeling of having more stool left after a bowel movement. Symptoms tend to come and go. They may be triggered by stress, mental health conditions, or certain foods. How is this diagnosed? This condition may be diagnosed based on a physical exam, your medical history, and your symptoms. You may have tests, such as:  Blood tests.  Stool test.  X-rays.  CT scan.  Colonoscopy. This is a procedure in which your GI tract is viewed with a long, thin, flexible tube. How is this treated? There is no cure for IBS, but treatment can help relieve symptoms. Treatment depends on the type of IBS you have, and may include:  Changes to your diet, such as: ? Avoiding foods that cause symptoms. ? Drinking more water. ? Following a low-FODMAP (fermentable oligosaccharides, disaccharides, monosaccharides, and polyols) diet for up to 6 weeks, or as told by your health  care provider. FODMAPs are sugars that are hard for some people to digest. ? Eating more fiber. ? Eating medium-sized meals at the same times every day.  Medicines. These may include: ? Fiber supplements, if you have constipation. ? Medicine to control diarrhea (antidiarrheal medicines). ? Medicine to help control muscle tightening (spasms) in your GI tract (antispasmodic medicines). ? Medicines to help with mental health conditions, such as antidepressants or tranquilizers.  Talk therapy or counseling.  Working with a diet and nutrition specialist (dietitian) to help create a food plan that is right for you.  Managing your stress. Follow these instructions at home: Eating and drinking  Eat a healthy diet.  Eat medium-sized  meals at about the same time every day. Do not eat large meals.  Gradually eat more fiber-rich foods. These include whole grains, fruits, and vegetables. This may be especially helpful if you have IBS with constipation.  Eat a diet low in FODMAPs.  Drink enough fluid to keep your urine pale yellow.  Keep a journal of foods that seem to trigger symptoms.  Avoid foods and drinks that: ? Contain added sugar. ? Make your symptoms worse. Dairy products, caffeinated drinks, and carbonated drinks can make symptoms worse for some people. General instructions  Take over-the-counter and prescription medicines and supplements only as told by your health care provider.  Get enough exercise. Do at least 150 minutes of moderate-intensity exercise each week.  Manage your stress. Getting enough sleep and exercise can help you manage stress.  Keep all follow-up visits as told by your health care provider and therapist. This is important. Alcohol Use  Do not drink alcohol if: ? Your health care provider tells you not to drink. ? You are pregnant, may be pregnant, or are planning to become pregnant.  If you drink alcohol, limit how much you have: ? 0-1 drink a day for women. ? 0-2 drinks a day for men.  Be aware of how much alcohol is in your drink. In the [place] one drink equals one typical bottle of beer (12 oz), one-half glass of wine (5 oz), or one shot of hard liquor (1 oz). Contact a health care provider if you have:  Constant pain.  Weight loss.  Difficulty or pain when swallowing.  Diarrhea that gets worse. Get help right away if you have:  Severe abdominal pain.  Fever.  Diarrhea with symptoms of dehydration, such as dizziness or dry mouth.  Bright red blood in your stool.  Stool that is black and tarry.  Abdominal swelling.  Vomiting that does not stop.  Blood in your vomit. Summary  Irritable bowel syndrome (IBS) is not one specific disease. It is a group of  symptoms that affects digestion.  Your intestines may become more sensitive and overreact to certain things. This may be especially true when you eat certain foods or when you are under stress.  There is no cure for IBS, but treatment can help relieve symptoms. This information is not intended to replace advice given to you by your health care provider. Make sure you discuss any questions you have with your health care provider. Document Revised: 04/07/2017 Document Reviewed: 04/07/2017 Elsevier Patient Education  2020 [employer].

## 2019-12-02 NOTE — Progress Notes (Signed)
Patient ID: Stacey Wilson, female    DOB: Oct 27, 1980, 39 y.o.   MRN: [id number]    Chief Complaint  Patient presents with  . Abdominal Pain   Subjective:    HPI Pt having abdominal issues for years- this is not a new issue. Pt states she has always had abdominal issues. Pt has had constipation, diarrhea, nausea. Pt gassy and weak. On Monday pt states her stomach was burning. Had a clay like white in color bowel movement. (see message from 11/29/19). Pt takes Senna to help stay regular but does not help. Has not been to GI. Labs done 8/4 and look good.  Medical History Stacey Wilson has a past medical history of Anxiety, History of kidney stones, Renal calculi, Urgency of urination, and Warts, genital.   Outpatient Encounter Medications as of 12/02/2019  Medication Sig  . clonazePAM (KLONOPIN) 0.5 MG tablet Take 1 tablet (0.5 mg total) by mouth 2 (two) times daily as needed for anxiety.  . fluticasone (FLONASE) 50 MCG/ACT nasal spray PLACE 2 SPRAYS IN EACH NOSTRIL EACH DAY  . norethindrone (MICRONOR) 0.35 MG tablet Take 1 tablet (0.35 mg total) by mouth daily.   No facility-administered encounter medications on file as of 12/02/2019.     Review of Systems  Constitutional: Negative for chills, diaphoresis and unexpected weight change.  HENT: Negative.   Eyes: Negative.   Respiratory: Negative.   Cardiovascular: Negative.   Gastrointestinal: Positive for abdominal distention, abdominal pain, constipation and diarrhea. Negative for rectal pain.       For years, patient has dealt with alternating diarrhea/constipaton/abdominal pain that is relieved by defecation. Denies blood in stool. Denies family history of colon cancer.  Endocrine: Negative.   Genitourinary: Negative.   Musculoskeletal: Negative.   Skin: Negative.   Allergic/Immunologic: Negative.   Neurological: Negative.   Hematological: Negative.   Psychiatric/Behavioral: Negative.      Vitals BP 118/72   Pulse 98   Temp  97.8 F (36.6 C)   Ht 5\' 5"  (1.651 m)   Wt 204 lb (92.5 kg)   SpO2 98%   BMI 33.95 kg/m   Objective:   Physical Exam Vitals and nursing note reviewed.  Constitutional:      Appearance: She is well-developed.  Cardiovascular:     Rate and Rhythm: Normal rate and regular rhythm.     Heart sounds: Normal heart sounds.  Pulmonary:     Effort: Pulmonary effort is normal.     Breath sounds: Normal breath sounds.  Abdominal:     General: Bowel sounds are normal.     Palpations: Abdomen is soft.     Tenderness: There is no abdominal tenderness. There is no guarding or rebound. Negative signs include Murphy's sign and McBurney's sign.  Skin:    General: Skin is warm and dry.  Neurological:     General: No focal deficit present.     Mental Status: She is alert and oriented to person, place, and time.  Psychiatric:        Mood and Affect: Mood normal.        Behavior: Behavior normal.      Assessment and Plan   1. Abnormal bowel movement Maciel presents today with very long history of alternating diarrhea and constipation with associated abdominal pain that is relieved by defecation. Will get stool sample after her vacation on 8/17, just to make sure there isn't something there that needs to be addressed. Patient agrees that since this problem has been  going on for years she should see GI specialist for colonoscopy. She welcomes the referral and wants to see if she can get a definitive diagnosis.   - Stool Culture - Ova and parasite examination - Ambulatory referral to Gastroenterology  2. Abdominal pain, unspecified abdominal location Pain is usually relieved with defecation. No pain today with abdominal exam.  - Stool Culture - Ova and parasite examination - Ambulatory referral to Gastroenterology   Understands to follow-up if she gets worse while waiting on GI visit.  Patient understands and agrees with plan of care discussed today.  Follow-up as needed and for annual  wellness exam.   Novella Olive, NP 12/02/2019

## 2019-12-07 ENCOUNTER — Encounter (INDEPENDENT_AMBULATORY_CARE_PROVIDER_SITE_OTHER): Payer: Self-pay | Admitting: Gastroenterology

## 2019-12-13 ENCOUNTER — Ambulatory Visit (INDEPENDENT_AMBULATORY_CARE_PROVIDER_SITE_OTHER): Payer: Managed Care, Other (non HMO) | Admitting: Adult Health

## 2019-12-13 ENCOUNTER — Encounter: Payer: Self-pay | Admitting: Adult Health

## 2019-12-13 ENCOUNTER — Other Ambulatory Visit: Payer: Self-pay

## 2019-12-13 VITALS — BP 135/87 | HR 76 | Ht 65.0 in | Wt 206.4 lb

## 2019-12-13 DIAGNOSIS — N6489 Other specified disorders of breast: Secondary | ICD-10-CM

## 2019-12-13 DIAGNOSIS — M25519 Pain in unspecified shoulder: Secondary | ICD-10-CM

## 2019-12-13 DIAGNOSIS — R7989 Other specified abnormal findings of blood chemistry: Secondary | ICD-10-CM

## 2019-12-13 DIAGNOSIS — Z13220 Encounter for screening for lipoid disorders: Secondary | ICD-10-CM

## 2019-12-13 DIAGNOSIS — N62 Hypertrophy of breast: Secondary | ICD-10-CM | POA: Diagnosis not present

## 2019-12-13 DIAGNOSIS — M542 Cervicalgia: Secondary | ICD-10-CM

## 2019-12-13 NOTE — Progress Notes (Signed)
  Subjective:     Patient ID: Stacey Wilson, female   DOB: [birth year], 39 y.o.   MRN: [id number]  HPI Stacey Wilson is a 39 year old white female,married, G1P1, back in follow up on swelling in underarms, she says it is better. She was to get labs in June and did, but looks like I discontinued some how. PCP is [facility].  Review of Systems Swelling better Has neck and shoulder pain from large breasts Reviewed past medical,surgical, social and family history. Reviewed medications and allergies.     Objective:   Physical Exam BP 135/87 (BP Location: Left Arm, Patient Position: Sitting, Cuff Size: Normal)   Pulse 76   Ht 5\' 5"  (1.651 m)   Wt 206 lb 6.4 oz (93.6 kg)   LMP 12/13/2019   BMI 34.35 kg/m   Skin warm and dry,  Breasts:no dominate palpable mass, retraction or nipple discharge, L>R and there is no swelling right under arm and almost none under left, esp if breast pulled in.    Assessment:     1. Elevated TSH Check TSH  2. Screening cholesterol level Check lipids  3. Large breasts Call Dr 12/15/2019 for consult   4. Breast asymmetry in female   5. Neck and shoulder pain     Plan:     Follow up prn

## 2019-12-14 LAB — LIPID PANEL
Chol/HDL Ratio: 3.6 ratio (ref 0.0–4.4)
Cholesterol, Total: 265 mg/dL — ABNORMAL HIGH (ref 100–199)
HDL: 73 mg/dL (ref >39)
LDL Chol Calc (NIH): 179 mg/dL — ABNORMAL HIGH (ref 0–99)
Triglycerides: 79 mg/dL (ref 0–149)
VLDL Cholesterol Cal: 13 mg/dL (ref 5–40)

## 2019-12-14 LAB — TSH: TSH: 2.39 u[IU]/mL (ref 0.450–4.500)

## 2019-12-18 LAB — STOOL CULTURE: E coli, Shiga toxin Assay: NEGATIVE

## 2019-12-19 ENCOUNTER — Ambulatory Visit: Payer: PRIVATE HEALTH INSURANCE | Admitting: Adult Health

## 2019-12-22 LAB — OVA AND PARASITE EXAMINATION

## 2019-12-26 ENCOUNTER — Encounter (INDEPENDENT_AMBULATORY_CARE_PROVIDER_SITE_OTHER): Payer: Self-pay | Admitting: Gastroenterology

## 2019-12-26 ENCOUNTER — Other Ambulatory Visit (INDEPENDENT_AMBULATORY_CARE_PROVIDER_SITE_OTHER): Payer: Self-pay | Admitting: *Deleted

## 2019-12-26 ENCOUNTER — Encounter (INDEPENDENT_AMBULATORY_CARE_PROVIDER_SITE_OTHER): Payer: Self-pay | Admitting: *Deleted

## 2019-12-26 ENCOUNTER — Ambulatory Visit (INDEPENDENT_AMBULATORY_CARE_PROVIDER_SITE_OTHER): Payer: Managed Care, Other (non HMO) | Admitting: Gastroenterology

## 2019-12-26 ENCOUNTER — Other Ambulatory Visit (INDEPENDENT_AMBULATORY_CARE_PROVIDER_SITE_OTHER): Payer: Self-pay | Admitting: Gastroenterology

## 2019-12-26 ENCOUNTER — Telehealth (INDEPENDENT_AMBULATORY_CARE_PROVIDER_SITE_OTHER): Payer: Self-pay | Admitting: *Deleted

## 2019-12-26 ENCOUNTER — Other Ambulatory Visit: Payer: Self-pay

## 2019-12-26 VITALS — BP 126/83 | HR 87 | Temp 98.7°F | Ht 65.0 in | Wt 204.8 lb

## 2019-12-26 DIAGNOSIS — R194 Change in bowel habit: Secondary | ICD-10-CM

## 2019-12-26 DIAGNOSIS — R140 Abdominal distension (gaseous): Secondary | ICD-10-CM | POA: Diagnosis not present

## 2019-12-26 DIAGNOSIS — R1084 Generalized abdominal pain: Secondary | ICD-10-CM | POA: Diagnosis not present

## 2019-12-26 DIAGNOSIS — K582 Mixed irritable bowel syndrome: Secondary | ICD-10-CM

## 2019-12-26 DIAGNOSIS — R198 Other specified symptoms and signs involving the digestive system and abdomen: Secondary | ICD-10-CM

## 2019-12-26 DIAGNOSIS — K589 Irritable bowel syndrome without diarrhea: Secondary | ICD-10-CM | POA: Insufficient documentation

## 2019-12-26 MED ORDER — HYOSCYAMINE SULFATE SL 0.125 MG SL SUBL
0.1250 mg | SUBLINGUAL_TABLET | Freq: Three times a day (TID) | SUBLINGUAL | 3 refills | Status: DC | PRN
Start: 2019-12-26 — End: 2020-11-07

## 2019-12-26 MED ORDER — SUTAB 1479-225-188 MG PO TABS: 1.0000 | ORAL_TABLET | Freq: Once | ORAL | 0 refills | Status: DC

## 2019-12-26 NOTE — Progress Notes (Signed)
Patient profile: Stacey Wilson is a 39 y.o. female seen for evaluation of abd pain.  History of Present Illness: Stacey Wilson is seen today for evaluation of chronic abdominal issues which are worsening recently.  She reports constant bloating, gas, abdominal pain.  This been going on for years but is worse recently to the point that she is frequently missing work due to the symptoms.  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  Feels often she cannot sleep due to symptoms as well.  Reports diffuse abdominal cramping that will be severe lasting several hours at a time and eventually ease off/decrease after she has a bowel movement. She has alternating bowel habits, can vary from constipation to diarrhea. Describes stools 1-6 on the Bristol stool scale.  She takes senna plus daily and still has episodes of several days with no stools.  Diarrhea with multiple stools in the same day.  She has not seen any blood in stool but does have some hemorrhoid pain when bowel habits are aggravated.  She has not been able to find any food triggers and diarrhea.  She denies nausea vomiting but does have some chronic GERD and burning symptom.  She avoids tomatoes etc.  No dysphagia.  Appetite good.  Rare NSAIDs, smokes 1/2 pack a day-trying to quit and vaping more.  Alcohol infrequently twice a month.  Denies family history of colon polyps colon cancer, IBD.  No known celiac disease.  She has not been able to find any food triggers to her symptoms.  Wt Readings from Last 3 Encounters:  12/26/19 204 lb 12.8 oz (92.9 kg)  12/13/19 206 lb 6.4 oz (93.6 kg)  12/02/19 204 lb (92.5 kg)     Last Colonoscopy: None prior Last Endoscopy: None prior   Past Medical History:  Past Medical History:  Diagnosis Date  . Anxiety   . History of kidney stones   . Renal calculi    bilateral  . Urgency of urination   . Warts, genital     Problem List: Patient Active Problem List   Diagnosis Date Noted  . Screening  cholesterol level 12/13/2019  . Elevated TSH 12/13/2019  . Abnormal bowel movement 12/02/2019  . Abdominal pain 12/02/2019  . Encounter for gynecological examination with Papanicolaou smear of cervix 10/13/2019  . Breast asymmetry in female 10/13/2019  . Large breasts 10/13/2019  . Neck and shoulder pain 10/13/2019  . Pap smear, as part of routine gynecological examination 08/07/2016  . Warts, genital 07/03/2015  . Itching in the vaginal area 07/03/2015  . Anxiety 12/23/2013  . Vaginal discharge 12/16/2013  . Contraceptive management 11/29/2013    Past Surgical History: Past Surgical History:  Procedure Laterality Date  . BREAST SURGERY Right 2002   benign cyst  . CYSTOSCOPY W/ URETERAL STENT PLACEMENT Bilateral 05/19/2016   Procedure: CYSTOSCOPY WITH RETROGRADE PYELOGRAM/URETEROSCOPY/STONE EXTRACTION WITH BASKET / STENT PLACEMENT;  Surgeon: Malen Gauze, MD;  Location: [facility];  Service: Urology;  Laterality: Bilateral;  . CYSTOSCOPY WITH RETROGRADE PYELOGRAM, URETEROSCOPY AND STENT PLACEMENT Bilateral 07/25/2015   Procedure: CYSTOSCOPY WITH BILATERAL RETROGRADE PYELOGRAM, BILATERAL URETEROSCOPY AND BILATERAL STENT PLACEMENT;  Surgeon: Malen Gauze, MD;  Location: [facility];  Service: Urology;  Laterality: Bilateral;  . STONE EXTRACTION WITH BASKET Bilateral 07/25/2015   Procedure: BILATERAL RENAL STONE EXTRACTION WITH BASKET;  Surgeon: Malen Gauze, MD;  Location: [facility];  Service: Urology;  Laterality: Bilateral;    Allergies: Allergies  Allergen Reactions  .  Nystatin Itching      Home Medications:  Current Outpatient Medications:  .  clonazePAM (KLONOPIN) 0.5 MG tablet, Take 1 tablet (0.5 mg total) by mouth 2 (two) times daily as needed for anxiety., Disp: 30 tablet, Rfl: 1 .  fluticasone (FLONASE) 50 MCG/ACT nasal spray, PLACE 2 SPRAYS IN EACH NOSTRIL EACH DAY, Disp: 48 g, Rfl: 1 .  norethindrone (MICRONOR) 0.35 MG tablet, Take 1 tablet (0.35  mg total) by mouth daily., Disp: 84 tablet, Rfl: 4 .  Hyoscyamine Sulfate SL (LEVSIN/SL) 0.125 MG SUBL, Place 0.125 mg under the tongue 3 (three) times daily as needed (abd pain, diarrhea)., Disp: 90 tablet, Rfl: 3   Family History: Denies any family history of colon polyps, colon cancer, IBD, celiac.  Social History:   reports that she has been smoking cigarettes. She has a 7.50 pack-year smoking history. She has never used smokeless tobacco. She reports current alcohol use of about 1.0 standard drink of alcohol per week. She reports that she does not use drugs.   Review of Systems: Constitutional: Denies weight loss/weight gain  Eyes: No changes in vision. ENT: No oral lesions, sore throat.  GI: see HPI.  Heme/Lymph: No easy bruising.  CV: No chest pain.  GU: No hematuria.  Integumentary: No rashes.  Neuro: No headaches.  Psych: No depression/anxiety.  Endocrine: No heat/cold intolerance.  Allergic/Immunologic: No urticaria.  Resp: No cough, SOB.  Musculoskeletal: No joint swelling.    Physical Examination: BP 126/83 (BP Location: Right Arm, Patient Position: Sitting, Cuff Size: Normal)   Pulse 87   Temp 98.7 F (37.1 C) (Oral)   Ht 5\' 5"  (1.651 m)   Wt 204 lb 12.8 oz (92.9 kg)   LMP 12/13/2019   BMI 34.08 kg/m  Gen: NAD, alert and oriented x 4 HEENT: PEERLA, EOMI, Neck: supple, no JVD Chest: CTA bilaterally, no wheezes, crackles, or other adventitious sounds CV: RRR, no m/g/c/r Abd: soft, NT, ND, +BS in all four quadrants; no HSM, guarding, ridigity, or rebound tenderness Ext: no edema, well perfused with 2+ pulses, Skin: no rash or lesions noted on observed skin Lymph: no noted LAD  Data:    August 2021-CBC normal, Hepatic normal, BMP normal, lipase normal, O&P negative, stool culture negative, TSH normal    Assessment/Plan: Stacey Wilson is a 39 y.o. female  Stacey Wilson was seen today for abdominal pain.  Diagnoses and all orders for this visit:  Change in  bowel habits -     Celiac Disease Panel  Diffuse abdominal pain -     Celiac Disease Panel  Other orders -     Hyoscyamine Sulfate SL (LEVSIN/SL) 0.125 MG SUBL; Place 0.125 mg under the tongue 3 (three) times daily as needed (abd pain, diarrhea).     1.  Change in bowel habits/abdominal pain-most likely irritable bowel but given symptoms are severe and requiring her to miss work will evaluate further with colonoscopy to exclude structural normality.  Labs and stool studies normal as above.  We discussed trying a probiotic as well as using Levsin as needed.  She will keep a food log to try identify food triggers.  Also will check a celiac panel today.  No upper GI symptoms  Patient denies CP, SOB, and use of blood thinners. I discussed the risks and benefits of procedure including bleeding, perforation, infection, missed lesions, medication reactions and possible hospitalization or surgery if complications. All questions answered.  Denies prior issues with sedation   I personally performed the service,  non-incident to. (WP)  Laurine Blazer, [facility]

## 2019-12-26 NOTE — Telephone Encounter (Signed)
Patient needs Sutab (copay card) ° °

## 2019-12-26 NOTE — Patient Instructions (Signed)
Try probiotics Vear Clock Colon health or Align).  Keep food journal as discussed  Try to decrease carbonation  We are checking celiac labs and scheduling colonoscopy.

## 2019-12-27 LAB — CELIAC DISEASE PANEL
(tTG) Ab, IgA: 1 U/mL
(tTG) Ab, IgG: 1 U/mL
Gliadin IgA: 5 Units
Gliadin IgG: 5 Units
Immunoglobulin A: 205 mg/dL (ref 47–310)

## 2020-01-06 ENCOUNTER — Other Ambulatory Visit: Payer: Self-pay

## 2020-01-06 ENCOUNTER — Other Ambulatory Visit (HOSPITAL_COMMUNITY)
Admission: RE | Admit: 2020-01-06 | Discharge: 2020-01-06 | Disposition: A | Discharge status: Home / Self Care | Payer: Managed Care, Other (non HMO) | Source: Ambulatory Visit | Attending: Gastroenterology | Admitting: Gastroenterology

## 2020-01-06 DIAGNOSIS — Z01812 Encounter for preprocedural laboratory examination: Secondary | ICD-10-CM | POA: Diagnosis present

## 2020-01-06 DIAGNOSIS — F17210 Nicotine dependence, cigarettes, uncomplicated: Secondary | ICD-10-CM | POA: Diagnosis not present

## 2020-01-06 DIAGNOSIS — Z20822 Contact with and (suspected) exposure to covid-19: Secondary | ICD-10-CM | POA: Insufficient documentation

## 2020-01-06 DIAGNOSIS — R1084 Generalized abdominal pain: Secondary | ICD-10-CM | POA: Diagnosis present

## 2020-01-06 DIAGNOSIS — F419 Anxiety disorder, unspecified: Secondary | ICD-10-CM | POA: Diagnosis not present

## 2020-01-06 DIAGNOSIS — G4730 Sleep apnea, unspecified: Secondary | ICD-10-CM | POA: Diagnosis not present

## 2020-01-06 DIAGNOSIS — Z881 Allergy status to other antibiotic agents status: Secondary | ICD-10-CM | POA: Diagnosis not present

## 2020-01-06 DIAGNOSIS — K648 Other hemorrhoids: Secondary | ICD-10-CM | POA: Diagnosis not present

## 2020-01-06 DIAGNOSIS — Z79899 Other long term (current) drug therapy: Secondary | ICD-10-CM | POA: Diagnosis not present

## 2020-01-06 LAB — PREGNANCY, URINE: Preg Test, Ur: NEGATIVE

## 2020-01-06 LAB — SARS CORONAVIRUS 2 (TAT 6-24 HRS): SARS Coronavirus 2: NEGATIVE

## 2020-01-10 ENCOUNTER — Encounter (HOSPITAL_COMMUNITY): Payer: Self-pay | Admitting: Gastroenterology

## 2020-01-10 ENCOUNTER — Encounter (HOSPITAL_COMMUNITY)
Admission: RE | Disposition: A | Discharge status: Home / Self Care | Payer: Self-pay | Source: Home / Self Care | Attending: Gastroenterology

## 2020-01-10 ENCOUNTER — Ambulatory Visit (HOSPITAL_COMMUNITY): Payer: Managed Care, Other (non HMO) | Admitting: Anesthesiology

## 2020-01-10 ENCOUNTER — Ambulatory Visit (HOSPITAL_COMMUNITY)
Admission: RE | Admit: 2020-01-10 | Discharge: 2020-01-10 | Disposition: A | Discharge status: Home / Self Care | Payer: Managed Care, Other (non HMO) | Attending: Gastroenterology | Admitting: Gastroenterology

## 2020-01-10 ENCOUNTER — Other Ambulatory Visit: Payer: Self-pay

## 2020-01-10 DIAGNOSIS — R109 Unspecified abdominal pain: Secondary | ICD-10-CM

## 2020-01-10 DIAGNOSIS — K648 Other hemorrhoids: Secondary | ICD-10-CM

## 2020-01-10 DIAGNOSIS — G4730 Sleep apnea, unspecified: Secondary | ICD-10-CM | POA: Insufficient documentation

## 2020-01-10 DIAGNOSIS — F17210 Nicotine dependence, cigarettes, uncomplicated: Secondary | ICD-10-CM | POA: Insufficient documentation

## 2020-01-10 DIAGNOSIS — Z881 Allergy status to other antibiotic agents status: Secondary | ICD-10-CM | POA: Insufficient documentation

## 2020-01-10 DIAGNOSIS — R1084 Generalized abdominal pain: Secondary | ICD-10-CM | POA: Insufficient documentation

## 2020-01-10 DIAGNOSIS — K582 Mixed irritable bowel syndrome: Secondary | ICD-10-CM

## 2020-01-10 DIAGNOSIS — Z79899 Other long term (current) drug therapy: Secondary | ICD-10-CM | POA: Insufficient documentation

## 2020-01-10 DIAGNOSIS — F419 Anxiety disorder, unspecified: Secondary | ICD-10-CM | POA: Insufficient documentation

## 2020-01-10 HISTORY — PX: COLONOSCOPY WITH PROPOFOL: SHX5780

## 2020-01-10 HISTORY — PX: BIOPSY: SHX5522

## 2020-01-10 SURGERY — COLONOSCOPY WITH PROPOFOL
Anesthesia: General

## 2020-01-10 MED ORDER — LACTATED RINGERS IV SOLN: INTRAVENOUS | Status: DC | PRN

## 2020-01-10 MED ORDER — PROPOFOL 10 MG/ML IV BOLUS
INTRAVENOUS | Status: DC | PRN
  Administered 2020-01-10: 100 mg via INTRAVENOUS

## 2020-01-10 MED ORDER — PROPOFOL 10 MG/ML IV BOLUS
INTRAVENOUS | Status: AC
  Filled 2020-01-10: qty 40

## 2020-01-10 MED ORDER — PROPOFOL 500 MG/50ML IV EMUL
INTRAVENOUS | Status: DC | PRN
  Administered 2020-01-10: 200 ug/kg/min via INTRAVENOUS

## 2020-01-10 MED ORDER — STERILE WATER FOR IRRIGATION IR SOLN
Status: DC | PRN
  Administered 2020-01-10: 100 mL

## 2020-01-10 MED ORDER — LACTATED RINGERS IV SOLN: Freq: Once | INTRAVENOUS | Status: AC

## 2020-01-10 NOTE — Discharge Instructions (Signed)
Colonoscopy, Adult, Care After This sheet gives you information about how to care for yourself after your procedure. Your doctor may also give you more specific instructions. If you have problems or questions, call your doctor. What can I expect after the procedure? After the procedure, it is common to have:  A small amount of blood in your poop (stool) for 24 hours.  Some gas.  Mild cramping or bloating in your belly (abdomen). Follow these instructions at home: Eating and drinking   Drink enough fluid to keep your pee (urine) pale yellow.  Follow instructions from your doctor about what you cannot eat or drink.  Return to your normal diet as told by your doctor. Avoid heavy or fried foods that are hard to digest. Activity  Rest as told by your doctor.  Do not sit for a long time without moving. Get up to take short walks every 1-2 hours. This is important. Ask for help if you feel weak or unsteady.  Return to your normal activities as told by your doctor. Ask your doctor what activities are safe for you. To help cramping and bloating:   Try walking around.  Put heat on your belly as told by your doctor. Use the heat source that your doctor recommends, such as a moist heat pack or a heating pad. ? Put a towel between your skin and the heat source. ? Leave the heat on for 20-30 minutes. ? Remove the heat if your skin turns bright red. This is very important if you are unable to feel pain, heat, or cold. You may have a greater risk of getting burned. General instructions  For the first 24 hours after the procedure: ? Do not drive or use machinery. ? Do not sign important documents. ? Do not drink alcohol. ? Do your daily activities more slowly than normal. ? Eat foods that are soft and easy to digest.  Take over-the-counter or prescription medicines only as told by your doctor.  Keep all follow-up visits as told by your doctor. This is important. Contact a doctor  if:  You have blood in your poop 2-3 days after the procedure. Get help right away if:  You have more than a small amount of blood in your poop.  You see large clumps of tissue (blood clots) in your poop.  Your belly is swollen.  You feel like you may vomit (nauseous).  You vomit.  You have a fever.  You have belly pain that gets worse, and medicine does not help your pain. Summary  After the procedure, it is common to have a small amount of blood in your poop. You may also have mild cramping and bloating in your belly.  For the first 24 hours after the procedure, do not drive or use machinery, do not sign important documents, and do not drink alcohol.  Get help right away if you have a lot of blood in your poop, feel like you may vomit, have a fever, or have more belly pain. This information is not intended to replace advice given to you by your health care provider. Make sure you discuss any questions you have with your health care provider. Document Revised: 11/08/2018 Document Reviewed: 11/08/2018 Elsevier Patient Education  2020 [employer]   You are being discharged to home.  Low FODMAP diet.  We are waiting for your pathology results.  Your physician has recommended a repeat colonoscopy at age 39 for screening purposes.    Hemorrhoids Hemorrhoids are  swollen veins in and around the rectum or anus. There are two types of hemorrhoids:  Internal hemorrhoids. These occur in the veins that are just inside the rectum. They may poke through to the outside and become irritated and painful.  External hemorrhoids. These occur in the veins that are outside the anus and can be felt as a painful swelling or hard lump near the anus. Most hemorrhoids do not cause serious problems, and they can be managed with home treatments such as diet and lifestyle changes. If home treatments do not help the symptoms, procedures can be done to shrink or remove the hemorrhoids. What are the  causes? This condition is caused by increased pressure in the anal area. This pressure may result from various things, including:  Constipation.  Straining to have a bowel movement.  Diarrhea.  Pregnancy.  Obesity.  Sitting for long periods of time.  Heavy lifting or other activity that causes you to strain.  Anal sex.  Riding a bike for a long period of time. What are the signs or symptoms? Symptoms of this condition include:  Pain.  Anal itching or irritation.  Rectal bleeding.  Leakage of stool (feces).  Anal swelling.  One or more lumps around the anus. How is this diagnosed? This condition can often be diagnosed through a visual exam. Other exams or tests may also be done, such as:  An exam that involves feeling the rectal area with a gloved hand (digital rectal exam).  An exam of the anal canal that is done using a small tube (anoscope).  A blood test, if you have lost a significant amount of blood.  A test to look inside the colon using a flexible tube with a camera on the end (sigmoidoscopy or colonoscopy). How is this treated? This condition can usually be treated at home. However, various procedures may be done if dietary changes, lifestyle changes, and other home treatments do not help your symptoms. These procedures can help make the hemorrhoids smaller or remove them completely. Some of these procedures involve surgery, and others do not. Common procedures include:  Rubber band ligation. Rubber bands are placed at the base of the hemorrhoids to cut off their blood supply.  Sclerotherapy. Medicine is injected into the hemorrhoids to shrink them.  Infrared coagulation. A type of light energy is used to get rid of the hemorrhoids.  Hemorrhoidectomy surgery. The hemorrhoids are surgically removed, and the veins that supply them are tied off.  Stapled hemorrhoidopexy surgery. The surgeon staples the base of the hemorrhoid to the rectal wall. Follow  these instructions at home: Eating and drinking   Eat foods that have a lot of fiber in them, such as whole grains, beans, nuts, fruits, and vegetables.  Ask your health care provider about taking products that have added fiber (fiber supplements).  Reduce the amount of fat in your diet. You can do this by eating low-fat dairy products, eating less red meat, and avoiding processed foods.  Drink enough fluid to keep your urine pale yellow. Managing pain and swelling   Take warm sitz baths for 20 minutes, 3-4 times a day to ease pain and discomfort. You may do this in a bathtub or using a portable sitz bath that fits over the toilet.  If directed, apply ice to the affected area. Using ice packs between sitz baths may be helpful. ? Put ice in a plastic bag. ? Place a towel between your skin and the bag. ? Leave the ice  on for 20 minutes, 2-3 times a day. General instructions  Take over-the-counter and prescription medicines only as told by your health care provider.  Use medicated creams or suppositories as told.  Get regular exercise. Ask your health care provider how much and what kind of exercise is best for you. In general, you should do moderate exercise for at least 30 minutes on most days of the week (150 minutes each week). This can include activities such as walking, biking, or yoga.  Go to the bathroom when you have the urge to have a bowel movement. Do not wait.  Avoid straining to have bowel movements.  Keep the anal area dry and clean. Use wet toilet paper or moist towelettes after a bowel movement.  Do not sit on the toilet for long periods of time. This increases blood pooling and pain.  Keep all follow-up visits as told by your health care provider. This is important. Contact a health care provider if you have:  Increasing pain and swelling that are not controlled by treatment or medicine.  Difficulty having a bowel movement, or you are unable to have a bowel  movement.  Pain or inflammation outside the area of the hemorrhoids. Get help right away if you have:  Uncontrolled bleeding from your rectum. Summary  Hemorrhoids are swollen veins in and around the rectum or anus.  Most hemorrhoids can be managed with home treatments such as diet and lifestyle changes.  Taking warm sitz baths can help ease pain and discomfort.  In severe cases, procedures or surgery can be done to shrink or remove the hemorrhoids. This information is not intended to replace advice given to you by your health care provider. Make sure you discuss any questions you have with your health care provider. Document Revised: 09/10/2018 Document Reviewed: 09/03/2017 Elsevier Patient Education  2020 [employer] Inc[employer]    Low-FODMAP Eating Plan  FODMAPs (fermentable oligosaccharides, disaccharides, monosaccharides, and polyols) are sugars that are hard for some people to digest. A low-FODMAP eating plan may help some people who have bowel (intestinal) diseases to manage their symptoms. This meal plan can be complicated to follow. Work with a diet and nutrition specialist (dietitian) to make a low-FODMAP eating plan that is right for you. A dietitian can make sure that you get enough nutrition from this diet. What are tips for following this plan? Reading food labels  Check labels for hidden FODMAPs such as: ? High-fructose syrup. ? Honey. ? Agave. ? Natural fruit flavors. ? Onion or garlic powder.  Choose low-FODMAP foods that contain 3-4 grams of fiber per serving.  Check food labels for serving sizes. Eat only one serving at a time to make sure FODMAP levels stay low. Meal planning  Follow a low-FODMAP eating plan for up to 6 weeks, or as told by your health care provider or dietitian.  To follow the eating plan: 1. Eliminate high-FODMAP foods from your diet completely. 2. Gradually reintroduce high-FODMAP foods into your diet one at a time. Most people should wait a  few days after introducing one high-FODMAP food before they introduce the next high-FODMAP food. Your dietitian can recommend how quickly you may reintroduce foods. 3. Keep a daily record of what you eat and drink, and make note of any symptoms that you have after eating. 4. Review your daily record with a dietitian regularly. Your dietitian can help you identify which foods you can eat and which foods you should avoid. General tips  Drink enough fluid each  day to keep your urine pale yellow.  Avoid processed foods. These often have added sugar and may be high in FODMAPs.  Avoid most dairy products, whole grains, and sweeteners.  Work with a dietitian to make sure you get enough fiber in your diet. Recommended foods Grains  Gluten-free grains, such as rice, oats, buckwheat, quinoa, corn, polenta, and millet. Gluten-free pasta, bread, or cereal. Rice noodles. Corn tortillas. Vegetables  Eggplant, zucchini, cucumber, peppers, green beans, Brussels sprouts, bean sprouts, lettuce, arugula, kale, Swiss chard, spinach, collard greens, bok choy, summer squash, potato, and tomato. Limited amounts of corn, carrot, and sweet potato. Green parts of scallions. Fruits  Bananas, oranges, lemons, limes, blueberries, raspberries, strawberries, grapes, cantaloupe, honeydew melon, kiwi, papaya, passion fruit, and pineapple. Limited amounts of dried cranberries, banana chips, and shredded coconut. Dairy  Lactose-free milk, yogurt, and kefir. Lactose-free cottage cheese and ice cream. Non-dairy milks, such as almond, coconut, hemp, and rice milk. Yogurts made of non-dairy milks. Limited amounts of goat cheese, brie, mozzarella, parmesan, swiss, and other hard cheeses. Meats and other protein foods  Unseasoned beef, pork, poultry, or fish. Eggs. Tomasa Blase. Tofu (firm) and tempeh. Limited amounts of nuts and seeds, such as almonds, walnuts, [place] nuts, pecans, peanuts, pumpkin seeds, chia seeds, and sunflower  seeds. Fats and oils  Butter-free spreads. Vegetable oils, such as olive, canola, and sunflower oil. Seasoning and other foods  Artificial sweeteners with names that do not end in "ol" such as aspartame, saccharine, and stevia. Maple syrup, white table sugar, raw sugar, brown sugar, and molasses. Fresh basil, coriander, parsley, rosemary, and thyme. Beverages  Water and mineral water. Sugar-sweetened soft drinks. Small amounts of orange juice or cranberry juice. Black and green tea. Most dry wines. Coffee. This may not be a complete list of low-FODMAP foods. Talk with your dietitian for more information. Foods to avoid Grains  Wheat, including kamut, durum, and semolina. Barley and bulgur. Couscous. Wheat-based cereals. Wheat noodles, bread, crackers, and pastries. Vegetables  Chicory root, artichoke, asparagus, cabbage, snow peas, sugar snap peas, mushrooms, and cauliflower. Onions, garlic, leeks, and the white part of scallions. Fruits  Fresh, dried, and juiced forms of apple, pear, watermelon, peach, plum, cherries, apricots, blackberries, boysenberries, figs, nectarines, and mango. Avocado. Dairy  Milk, yogurt, ice cream, and soft cheese. Cream and sour cream. Milk-based sauces. Custard. Meats and other protein foods  Fried or fatty meat. Sausage. Cashews and pistachios. Soybeans, baked beans, black beans, chickpeas, kidney beans, fava beans, navy beans, lentils, and split peas. Seasoning and other foods  Any sugar-free gum or candy. Foods that contain artificial sweeteners such as sorbitol, mannitol, isomalt, or xylitol. Foods that contain honey, high-fructose corn syrup, or agave. Bouillon, vegetable stock, beef stock, and chicken stock. Garlic and onion powder. Condiments made with onion, such as hummus, chutney, pickles, relish, salad dressing, and salsa. Tomato paste. Beverages  Chicory-based drinks. Coffee substitutes. Chamomile tea. Fennel tea. Sweet or fortified wines such  as port or sherry. Diet soft drinks made with isomalt, mannitol, maltitol, sorbitol, or xylitol. Apple, pear, and mango juice. Juices with high-fructose corn syrup. This may not be a complete list of high-FODMAP foods. Talk with your dietitian to discuss what dietary choices are best for you.  Summary  A low-FODMAP eating plan is a short-term diet that eliminates FODMAPs from your diet to help ease symptoms of certain bowel diseases.  The eating plan usually lasts up to 6 weeks. After that, high-FODMAP foods are restarted gradually, one at a time,  so you can find out which may be causing symptoms.  A low-FODMAP eating plan can be complicated. It is best to work with a dietitian who has experience with this type of plan. This information is not intended to replace advice given to you by your health care provider. Make sure you discuss any questions you have with your health care provider. Document Revised: 03/27/2017 Document Reviewed: 12/09/2016 Elsevier Patient Education  2020 [employer].

## 2020-01-10 NOTE — Anesthesia Preprocedure Evaluation (Addendum)
Anesthesia Evaluation  Patient identified by MRN, date of birth, ID band Patient awake    Reviewed: Allergy & Precautions, NPO status , Patient's Chart, lab work & pertinent test results  History of Anesthesia Complications Negative for: history of anesthetic complications  Airway Mallampati: III  TM Distance: >3 FB Neck ROM: Full    Dental  (+) Teeth Intact, Dental Advisory Given   Pulmonary Sleep apnea: snoring. , Current Smoker and Patient abstained from smoking.,    Pulmonary exam normal breath sounds clear to auscultation       Cardiovascular Exercise Tolerance: Good Normal cardiovascular exam Rhythm:Regular Rate:Normal     Neuro/Psych Anxiety    GI/Hepatic negative GI ROS, Neg liver ROS,   Endo/Other  negative endocrine ROS  Renal/GU Renal disease     Musculoskeletal negative musculoskeletal ROS (+)   Abdominal   Peds  Hematology negative hematology ROS (+)   Anesthesia Other Findings   Reproductive/Obstetrics negative OB ROS                           Anesthesia Physical Anesthesia Plan  ASA: II  Anesthesia Plan: General   Post-op Pain Management:    Induction: Intravenous  PONV Risk Score and Plan: TIVA  Airway Management Planned: Nasal Cannula and Natural Airway  Additional Equipment:   Intra-op Plan:   Post-operative Plan: Extubation in OR  Informed Consent: I have reviewed the patients History and Physical, chart, labs and discussed the procedure including the risks, benefits and alternatives for the proposed anesthesia with the patient or authorized representative who has indicated his/her understanding and acceptance.     Dental advisory given  Plan Discussed with: CRNA and Surgeon  Anesthesia Plan Comments:         Anesthesia Quick Evaluation

## 2020-01-10 NOTE — Transfer of Care (Signed)
Immediate Anesthesia Transfer of Care Note  Patient: Stacey Wilson  Procedure(s) Performed: COLONOSCOPY WITH PROPOFOL (N/A ) BIOPSY  Patient Location: PACU  Anesthesia Type:General  Level of Consciousness: awake, alert , oriented and patient cooperative  Airway & Oxygen Therapy: Patient Spontanous Breathing  Post-op Assessment: Report given to RN, Post -op Vital signs reviewed and stable and Patient moving all extremities X 4  Post vital signs: Reviewed and stable  Last Vitals:  Vitals Value Taken Time  BP    Temp    Pulse    Resp    SpO2      Last Pain:  Vitals:   01/10/20 0744  TempSrc:   PainSc: 0-No pain      Patients Stated Pain Goal: 2 (01/10/20 0657)  Complications: No complications documented.

## 2020-01-10 NOTE — Op Note (Signed)
[facility] Patient Name: Stacey Wilson Procedure Date: 01/10/2020 6:37 AM MRN: [id number] Date of Birth: February 11, 1981 Attending MD: Katrinka Blazing ,  CSN: [id number] Age: 39 Admit Type: Outpatient Procedure:                Colonoscopy Indications:              Generalized abdominal pain Providers:                Katrinka Blazing, Sheran Fava Referring MD:              Medicines:                Monitored Anesthesia Care Complications:            No immediate complications. Estimated Blood Loss:     Estimated blood loss: none. Procedure:                Pre-Anesthesia Assessment:                           - Prior to the procedure, a History and Physical                            was performed, and patient medications, allergies                            and sensitivities were reviewed. The patient's                            tolerance of previous anesthesia was reviewed.                           - The risks and benefits of the procedure and the                            sedation options and risks were discussed with the                            patient. All questions were answered and informed                            consent was obtained.                           - ASA Grade Assessment: I - A normal, healthy                            patient.                           After obtaining informed consent, the colonoscope                            was passed under direct vision. Throughout the                            procedure, the patient's blood pressure, pulse, and  oxygen saturations were monitored continuously. The                            [id number]-[id number] ([id number]) scope was introduced through                            the anus and advanced to the the terminal ileum.                            The colonoscopy was performed without difficulty.                            The patient tolerated the procedure well. The                             quality of the bowel preparation was good. Scope                            withdrawal time was 11 minutes. Scope In: 7:49:25 AM Scope Out: 8:15:54 AM Scope Withdrawal Time: 0 hours 14 minutes 33 seconds  Total Procedure Duration: 0 hours 26 minutes 29 seconds  Findings:      Hemorrhoids were found on perianal exam.      The terminal ileum appeared normal.      The colon (entire examined portion) appeared normal. Biopsies for       histology were taken with a cold forceps from the right colon and left       colon for evaluation of microscopic colitis.      Non-bleeding internal hemorrhoids were found during retroflexion. The       hemorrhoids were mild. Impression:               - Hemorrhoids found on perianal exam.                           - The examined portion of the ileum was normal.                           - The entire examined colon is normal. Biopsied.                           - Non-bleeding internal hemorrhoids. Moderate Sedation:      Per Anesthesia Care Recommendation:           - Discharge patient to home (ambulatory).                           - Low FODMAP diet.                           - Await pathology results.                           - Repeat colonoscopy at age 64 for screening                            purposes. Procedure Code(s):        ---  Professional ---                           [phone], GC, Colonoscopy, flexible; with biopsy,                            single or multiple Diagnosis Code(s):        --- Professional ---                           K64.8, Other hemorrhoids                           R10.84, Generalized abdominal pain CPT copyright 2019 American Medical Association. All rights reserved. The codes documented in this report are preliminary and upon coder review may  be revised to meet current compliance requirements. Katrinka Blazing, MD Katrinka Blazing,  01/10/2020 8:28:47 AM This report has been signed electronically. Number of Addenda:  0

## 2020-01-10 NOTE — Anesthesia Postprocedure Evaluation (Signed)
Anesthesia Post Note  Patient: Stacey Wilson  Procedure(s) Performed: COLONOSCOPY WITH PROPOFOL (N/A ) BIOPSY  Patient location during evaluation: Phase II Anesthesia Type: General Level of consciousness: awake, oriented, awake and alert and patient cooperative Pain management: satisfactory to patient Vital Signs Assessment: post-procedure vital signs reviewed and stable Respiratory status: spontaneous breathing, respiratory function stable and nonlabored ventilation Cardiovascular status: stable Postop Assessment: no apparent nausea or vomiting Anesthetic complications: no   No complications documented.   Last Vitals:  Vitals:   01/10/20 0657  BP: (!) 132/94  Pulse: 86  Resp: 11  Temp: 36.8 C  SpO2: 97%    Last Pain:  Vitals:   01/10/20 0744  TempSrc:   PainSc: 0-No pain                 Jadd Gasior

## 2020-01-10 NOTE — H&P (Signed)
Stacey Wilson is an 39 y.o. female.   Chief Complaint: Abdominal pain, change in bowel movements. HPI: 39 y/o F with PMH anxiety, coming to the hospital for evaluation of recurrent episodes abdominal pain and changes in bowel movements.  She reports a longstanding history of abdominal cramping symptoms in her abdomen.  She has been having episodes of constipation which are followed by explosive watery to loose diarrhea.  She is concerned that the symptoms have been happening more often and when she is outside.  She has recently committed a low FODMAP diet.  No family history of colorectal cancer.  Has never had a colonoscopy in the past.  Past Medical History:  Diagnosis Date  . Anxiety   . History of kidney stones   . Renal calculi    bilateral  . Urgency of urination   . Warts, genital     Past Surgical History:  Procedure Laterality Date  . BREAST SURGERY Right 2002   benign cyst  . CYSTOSCOPY W/ URETERAL STENT PLACEMENT Bilateral 05/19/2016   Procedure: CYSTOSCOPY WITH RETROGRADE PYELOGRAM/URETEROSCOPY/STONE EXTRACTION WITH BASKET / STENT PLACEMENT;  Surgeon: Malen Gauze, MD;  Location: [facility];  Service: Urology;  Laterality: Bilateral;  . CYSTOSCOPY WITH RETROGRADE PYELOGRAM, URETEROSCOPY AND STENT PLACEMENT Bilateral 07/25/2015   Procedure: CYSTOSCOPY WITH BILATERAL RETROGRADE PYELOGRAM, BILATERAL URETEROSCOPY AND BILATERAL STENT PLACEMENT;  Surgeon: Malen Gauze, MD;  Location: [facility];  Service: Urology;  Laterality: Bilateral;  . STONE EXTRACTION WITH BASKET Bilateral 07/25/2015   Procedure: BILATERAL RENAL STONE EXTRACTION WITH BASKET;  Surgeon: Malen Gauze, MD;  Location: [facility];  Service: Urology;  Laterality: Bilateral;    History reviewed. No pertinent family history. Social History:  reports that she has been smoking cigarettes. She has a 7.50 pack-year smoking history. She has never used smokeless tobacco. She reports current alcohol use  of about 1.0 standard drink of alcohol per week. She reports that she does not use drugs.  Allergies:  Allergies  Allergen Reactions  . Nystatin Itching    Medications Prior to Admission  Medication Sig Dispense Refill  . Ascorbic Acid (VITAMIN C) 100 MG tablet Take 100 mg by mouth daily.    . clonazePAM (KLONOPIN) 0.5 MG tablet Take 1 tablet (0.5 mg total) by mouth 2 (two) times daily as needed for anxiety. 30 tablet 1  . fluticasone (FLONASE) 50 MCG/ACT nasal spray PLACE 2 SPRAYS IN EACH NOSTRIL EACH DAY (Patient taking differently: Place 1 spray into both nostrils daily. PLACE 2 SPRAYS IN EACH NOSTRIL EACH DA) 48 g 1  . Hyoscyamine Sulfate SL (LEVSIN/SL) 0.125 MG SUBL Place 0.125 mg under the tongue 3 (three) times daily as needed (abd pain, diarrhea). 90 tablet 3  . loratadine (CLARITIN) 10 MG tablet Take 10 mg by mouth daily.    . norethindrone (MICRONOR) 0.35 MG tablet Take 1 tablet (0.35 mg total) by mouth daily. 84 tablet 4  . senna (SENOKOT) 8.6 MG TABS tablet Take 1 tablet by mouth daily as needed for mild constipation.      No results found for this or any previous visit (from the past 48 hour(s)). No results found.  Review of Systems  Constitutional: Negative.   HENT: Negative.   Eyes: Negative.   Respiratory: Negative.   Cardiovascular: Negative.   Gastrointestinal: Positive for abdominal pain, constipation and diarrhea.  Endocrine: Negative.   Genitourinary: Negative.   Musculoskeletal: Negative.   Skin: Negative.   Allergic/Immunologic: Negative.   Neurological:  Negative.   Hematological: Negative.   Psychiatric/Behavioral: Negative.     Blood pressure (!) 132/94, pulse 86, temperature 98.3 F (36.8 C), temperature source Oral, resp. rate 11, height 5\' 5"  (1.651 m), last menstrual period 12/13/2019, SpO2 97 %. Physical Exam  GENERAL: The patient is AO x3, in no acute distress. HEENT: Head is normocephalic and atraumatic. EOMI are intact. Mouth is well  hydrated and without lesions. NECK: Supple. No masses LUNGS: Clear to auscultation. No presence of rhonchi/wheezing/rales. Adequate chest expansion HEART: RRR, normal s1 and s2. ABDOMEN: Soft, nontender, no guarding, no peritoneal signs, and nondistended. BS +. No masses. EXTREMITIES: Without any cyanosis, clubbing, rash, lesions or edema. NEUROLOGIC: AOx3, no focal motor deficit. SKIN: no jaundice, no rashes  Assessment/Plan 39 y/o F with PMH anxiety, coming to the hospital for evaluation of recurrent episodes abdominal pain and changes in bowel movements.  Patient is at average risk for colorectal cancer.  We will proceed with colonoscopy today.  30, MD 01/10/2020, 7:41 AM

## 2020-01-10 NOTE — Anesthesia Postprocedure Evaluation (Signed)
Anesthesia Post Note  Patient: Stacey Wilson  Procedure(s) Performed: COLONOSCOPY WITH PROPOFOL (N/A ) BIOPSY  Anesthesia Type: General   No complications documented.   Last Vitals:  Vitals:   01/10/20 0657  BP: (!) 132/94  Pulse: 86  Resp: 11  Temp: 36.8 C  SpO2: 97%    Last Pain:  Vitals:   01/10/20 0744  TempSrc:   PainSc: 0-No pain                 Alabama Doig

## 2020-01-11 LAB — SURGICAL PATHOLOGY

## 2020-01-13 ENCOUNTER — Encounter (HOSPITAL_COMMUNITY): Payer: Self-pay | Admitting: Gastroenterology

## 2020-02-24 ENCOUNTER — Encounter: Payer: Self-pay | Admitting: Family Medicine

## 2020-02-24 ENCOUNTER — Other Ambulatory Visit: Payer: Self-pay

## 2020-02-24 ENCOUNTER — Telehealth (INDEPENDENT_AMBULATORY_CARE_PROVIDER_SITE_OTHER): Payer: Managed Care, Other (non HMO) | Admitting: Family Medicine

## 2020-02-24 ENCOUNTER — Telehealth: Payer: Self-pay | Admitting: Family Medicine

## 2020-02-24 DIAGNOSIS — J019 Acute sinusitis, unspecified: Secondary | ICD-10-CM

## 2020-02-24 MED ORDER — AMOXICILLIN-POT CLAVULANATE 875-125 MG PO TABS: 1.0000 | ORAL_TABLET | Freq: Two times a day (BID) | ORAL | 0 refills | Status: DC

## 2020-02-24 NOTE — Telephone Encounter (Signed)
[name], rachel are scheduled for a virtual visit with your provider today.    Just as we do with appointments in the office, we must obtain your consent to participate.  Your consent will be active for this visit and any virtual visit you may have with one of our providers in the next 365 days.    If you have a MyChart account, I can also send a copy of this consent to you electronically.  All virtual visits are billed to your insurance company just like a traditional visit in the office.  As this is a virtual visit, video technology does not allow for your provider to perform a traditional examination.  This may limit your provider's ability to fully assess your condition.  If your provider identifies any concerns that need to be evaluated in person or the need to arrange testing such as labs, EKG, etc, we will make arrangements to do so.    Although advances in technology are sophisticated, we cannot ensure that it will always work on either your end or our end.  If the connection with a video visit is poor, we may have to switch to a telephone visit.  With either a video or telephone visit, we are not always able to ensure that we have a secure connection.   I need to obtain your verbal consent now.   Are you willing to proceed with your visit today?   Stacey Wilson has provided verbal consent on 02/24/2020 for a virtual visit (video or telephone).   Marlowe Shores, LPN [id number]/8338  9:36 AM

## 2020-02-24 NOTE — Progress Notes (Signed)
Pt having congestion, cough and runny nose. Mucus was dark green now it is turning clear. Began getting sick Monday afternoon. Has been taking Dayquil and Nyquil and Mucinex. Mucinex has helped a lot.     Patient ID: Stacey Wilson, female    DOB: May 23, 1980, 39 y.o.   MRN: [id number]   Chief Complaint  Patient presents with  . Cough   Subjective:  CC: "I have a sinus infection"  Presents today via video visit, with a complaint of "I have a sinus infection "reports that she gets this infection same time every year.  Symptom onset was Monday afternoon.  Associated symptoms include chest tightness, runny nose, headache, productive cough, sinus pain and pressure..  Denies fever.  Feels like all of her symptoms are in her head and chest.  Has tried NyQuil, DayQuil, Mucinex DM with pretty good relief, staying hydrated.  For her work she has to be Covid tested regularly, her last lab test was this past Monday and the last rapid test was yesterday and both were negative.    Medical History Stacey Wilson has a past medical history of Anxiety, History of kidney stones, Renal calculi, Urgency of urination, and Warts, genital.   Outpatient Encounter Medications as of 02/24/2020  Medication Sig  . Ascorbic Acid (VITAMIN C) 100 MG tablet Take 100 mg by mouth daily.  . clonazePAM (KLONOPIN) 0.5 MG tablet Take 1 tablet (0.5 mg total) by mouth 2 (two) times daily as needed for anxiety.  . fluticasone (FLONASE) 50 MCG/ACT nasal spray PLACE 2 SPRAYS IN EACH NOSTRIL EACH DAY (Patient taking differently: Place 1 spray into both nostrils daily. PLACE 2 SPRAYS IN EACH NOSTRIL EACH DA)  . Hyoscyamine Sulfate SL (LEVSIN/SL) 0.125 MG SUBL Place 0.125 mg under the tongue 3 (three) times daily as needed (abd pain, diarrhea).  . loratadine (CLARITIN) 10 MG tablet Take 10 mg by mouth daily.  . norethindrone (MICRONOR) 0.35 MG tablet Take 1 tablet (0.35 mg total) by mouth daily.  Marland Kitchen senna (SENOKOT) 8.6 MG TABS tablet Take 1  tablet by mouth daily as needed for mild constipation.  Marland Kitchen amoxicillin-clavulanate (AUGMENTIN) 875-125 MG tablet Take 1 tablet by mouth 2 (two) times daily.   No facility-administered encounter medications on file as of 02/24/2020.     Review of Systems  Constitutional: Negative for fever.  HENT: Positive for rhinorrhea. Negative for sore throat.   Respiratory: Positive for cough.   Neurological: Positive for headaches.     Vitals There were no vitals taken for this visit.  Objective:   Physical Exam Unable: reports maxillary sinus pain and tenderness.   Assessment and Plan   1. Acute rhinosinusitis - amoxicillin-clavulanate (AUGMENTIN) 875-125 MG tablet; Take 1 tablet by mouth 2 (two) times daily.  Dispense: 20 tablet; Refill: 0      Novella Olive, NP 02/24/2020  Virtual Visit via Video Note  I connected with Stacey Wilson on 02/24/20 at 10:00 AM EDT by a video enabled telemedicine application and verified that I am speaking with the correct person using two identifiers.  Location: Patient: home Provider: office   I discussed the limitations of evaluation and management by telemedicine and the availability of in person appointments. The patient expressed understanding and agreed to proceed.  History of Present Illness:    Observations/Objective:   Assessment and Plan:   Follow Up Instructions:    I discussed the assessment and treatment plan with the patient. The patient was provided an opportunity to ask  questions and all were answered. The patient agreed with the plan and demonstrated an understanding of the instructions.   The patient was advised to call back or seek an in-person evaluation if the symptoms worsen or if the condition fails to improve as anticipated.  I provided  8 minutes of non-face-to-face time during this encounter.

## 2020-03-30 ENCOUNTER — Telehealth: Payer: Self-pay

## 2020-03-30 NOTE — Telephone Encounter (Signed)
Patient took second Covid shot today and feeling sick with fever and body aches. She is requesting note for work [employer] . Please advise

## 2020-05-04 ENCOUNTER — Encounter: Payer: Self-pay | Admitting: Family Medicine

## 2020-05-07 ENCOUNTER — Encounter: Payer: Self-pay | Admitting: Family Medicine

## 2020-05-08 ENCOUNTER — Encounter: Payer: Self-pay | Admitting: Family Medicine

## 2020-05-08 NOTE — Telephone Encounter (Signed)
Copied and pasted into pt's chart

## 2020-07-19 ENCOUNTER — Other Ambulatory Visit: Payer: Self-pay | Admitting: Family Medicine

## 2020-07-19 ENCOUNTER — Encounter: Payer: Self-pay | Admitting: Family Medicine

## 2020-07-19 MED ORDER — CLONAZEPAM 0.5 MG PO TABS: 0.5000 mg | ORAL_TABLET | Freq: Two times a day (BID) | ORAL | 0 refills | Status: DC | PRN

## 2020-07-19 NOTE — Telephone Encounter (Signed)
Nurses Please talk with Stacey Wilson Sorry she is having such difficulties Please make sure that she is not severely depressed or suicidal. I am not opposed to sending her in some clonazepam after the above is discussed. I also will work toward getting her in to be seen in the near future to discuss this issue in a more thorough and caring way. Please reach out to her/call her thank you-Dr. Lorin Picket

## 2020-09-10 ENCOUNTER — Encounter: Payer: Self-pay | Admitting: Family Medicine

## 2020-09-10 NOTE — Telephone Encounter (Signed)
Message changed into telephone call to put in pt chart

## 2020-09-11 ENCOUNTER — Encounter: Payer: Self-pay | Admitting: Family Medicine

## 2020-10-10 ENCOUNTER — Ambulatory Visit: Payer: Managed Care, Other (non HMO) | Admitting: Urology

## 2020-10-10 ENCOUNTER — Encounter: Payer: Self-pay | Admitting: Urology

## 2020-10-10 ENCOUNTER — Other Ambulatory Visit: Payer: Self-pay

## 2020-10-10 ENCOUNTER — Ambulatory Visit (HOSPITAL_COMMUNITY)
Admission: RE | Admit: 2020-10-10 | Discharge: 2020-10-10 | Disposition: A | Discharge status: Home / Self Care | Payer: Managed Care, Other (non HMO) | Source: Ambulatory Visit | Attending: Urology | Admitting: Urology

## 2020-10-10 VITALS — BP 136/90 | HR 101 | Ht 65.0 in | Wt 211.0 lb

## 2020-10-10 DIAGNOSIS — R1032 Left lower quadrant pain: Secondary | ICD-10-CM | POA: Diagnosis not present

## 2020-10-10 DIAGNOSIS — N200 Calculus of kidney: Secondary | ICD-10-CM

## 2020-10-10 MED ORDER — PROMETHAZINE HCL 12.5 MG PO TABS: 12.5000 mg | ORAL_TABLET | Freq: Four times a day (QID) | ORAL | 0 refills | Status: DC | PRN

## 2020-10-10 NOTE — Progress Notes (Signed)
10/10/2020 10:55 AM   Stacey Wilson Jul 20, 1980 [id number]  Referring provider: Babs Sciara, MD [address] [place],  [place] [address]  nephrolithiasis   HPI: Stacey Wilson is a 40yo here  for evaluation of nephrolithiasis. She was last seen in 06/2016. Starting last week she is having intermittent left flank pain that is sharp, moderate to severe and nonradiating. She was having associated nausea. She was having urinary urgency and frequency which is new. She passed 3 small calculi but her LUTS continue.   Here records from [facility] are as follows: I have kidney stones.  HPI: Stacey Wilson is a 40 year-old female established patient who is here for renal calculi.  The problem is on both sides. She first stated noticing pain on approximately 01/27/2016. This is not her first kidney stone. Her first stone was approximately 04/28/2006. She has had more than 5 stones prior to getting this one. She is not currently having flank pain, back pain, groin pain, nausea, vomiting, fever or chills. She has caught a stone in her urine strainer since her symptoms began.   She has had ureteral stent and ureteroscopy for treatment of her stones in the past.   05/07/2016: for the past 3 weeks she has been having intermittent right flank pain and has passed 4 small calculi. SHe underwent Renal US in 01/2016 which showed multiple bilateral renal calculi. She currently has urgency and a feeling of incomplete emptying.   06/04/2016: S/p b/l URS. Stone composition was 65% CaOX and 35% CaPhos   07/16/2016: S/p B/l URS. renal US shows no calculi. 24 hour urine shows low volume and hypercalcuria      PMH: Past Medical History:  Diagnosis Date   Anxiety    History of kidney stones    Renal calculi    bilateral   Urgency of urination    Warts, genital     Surgical History: Past Surgical History:  Procedure Laterality Date   BIOPSY  01/10/2020   Procedure: BIOPSY;  Surgeon: Dolores Frame,  MD;  Location: [facility];  Service: Gastroenterology;;   BREAST SURGERY Right 2002   benign cyst   COLONOSCOPY WITH PROPOFOL N/A 01/10/2020   Procedure: COLONOSCOPY WITH PROPOFOL;  Surgeon: Dolores Frame, MD;  Location: [facility];  Service: Gastroenterology;  Laterality: N/A;  730   CYSTOSCOPY W/ URETERAL STENT PLACEMENT Bilateral 05/19/2016   Procedure: CYSTOSCOPY WITH RETROGRADE PYELOGRAM/URETEROSCOPY/STONE EXTRACTION WITH BASKET / STENT PLACEMENT;  Surgeon: Malen Gauze, MD;  [facility];  Service: Urology;  Laterality: Bilateral;   CYSTOSCOPY WITH RETROGRADE PYELOGRAM, URETEROSCOPY AND STENT PLACEMENT Bilateral 07/25/2015   Procedure: CYSTOSCOPY WITH BILATERAL RETROGRADE PYELOGRAM, BILATERAL URETEROSCOPY AND BILATERAL STENT PLACEMENT;  Surgeon: Malen Gauze, MD;  Location: [facility];  Service: Urology;  Laterality: Bilateral;   STONE EXTRACTION WITH BASKET Bilateral 07/25/2015   Procedure: BILATERAL RENAL STONE EXTRACTION WITH BASKET;  Surgeon: Malen Gauze, MD;  Location: [facility];  Service: Urology;  Laterality: Bilateral;    Home Medications:  Allergies as of 10/10/2020       Reactions   Nystatin Itching        Medication List        Accurate as of October 10, 2020 10:55 AM. If you have any questions, ask your nurse or doctor.          amoxicillin-clavulanate 875-125 MG tablet Commonly known as: AUGMENTIN Take 1 tablet by mouth 2 (two) times daily.  clonazePAM 0.5 MG tablet Commonly known as: KLONOPIN Take 1 tablet (0.5 mg total) by mouth 2 (two) times daily as needed for anxiety.   fluticasone 50 MCG/ACT nasal spray Commonly known as: FLONASE PLACE 2 SPRAYS IN EACH NOSTRIL EACH DAY What changed:  how much to take how to take this when to take this additional instructions   Hyoscyamine Sulfate SL 0.125 MG Subl Commonly known as: Levsin/SL Place 0.125 mg under the tongue 3 (three) times daily as needed (abd  pain, diarrhea).   loratadine 10 MG tablet Commonly known as: CLARITIN Take 10 mg by mouth daily.   norethindrone 0.35 MG tablet Commonly known as: MICRONOR Take 1 tablet (0.35 mg total) by mouth daily.   senna 8.6 MG Tabs tablet Commonly known as: SENOKOT Take 1 tablet by mouth daily as needed for mild constipation.   vitamin C 100 MG tablet Take 100 mg by mouth daily.        Allergies:  Allergies  Allergen Reactions   Nystatin Itching    Family History: No family history on file.  Social History:  reports that she has been smoking cigarettes. She has a 7.50 pack-year smoking history. She has never used smokeless tobacco. She reports current alcohol use of about 1.0 standard drink of alcohol per week. She reports that she does not use drugs.  ROS: All other review of systems were reviewed and are negative except what is noted above in HPI  Physical Exam: BP 136/90   Pulse (!) 101   Ht 5' 5" (1.651 m)   Wt 211 lb (95.7 kg)   BMI 35.11 kg/m   Constitutional:  Alert and oriented, No acute distress. HEENT: Stacey Wilson, moist mucus membranes.  Trachea midline, no masses. Cardiovascular: No clubbing, cyanosis, or edema. Respiratory: Normal respiratory effort, no increased work of breathing. GI: Abdomen is soft, nontender, nondistended, no abdominal masses GU: No CVA tenderness.  Lymph: No cervical or inguinal lymphadenopathy. Skin: No rashes, bruises or suspicious lesions. Neurologic: Grossly intact, no focal deficits, moving all 4 extremities. Psychiatric: Normal mood and affect.  Laboratory Data: Lab Results  Component Value Date   WBC 7.6 11/30/2019   HGB 13.9 11/30/2019   HCT 41.9 11/30/2019   MCV 92 11/30/2019   PLT 248 11/30/2019    Lab Results  Component Value Date   CREATININE 0.69 11/30/2019    No results found for: PSA  No results found for: TESTOSTERONE  No results found for: HGBA1C  Urinalysis    Component Value Date/Time   LEUKOCYTESUR  small (1+) (A) 06/27/2015 1457    No results found for: LABMICR, WBCUA, RBCUA, LABEPIT, MUCUS, BACTERIA  Pertinent Imaging: Ct stone study: Images reviewed and discussed with the patient No results found for this or any previous visit.  No results found for this or any previous visit.  No results found for this or any previous visit.  No results found for this or any previous visit.  Results for orders placed during the hospital encounter of 07/04/16  US Renal  Narrative CLINICAL DATA:  Follow-up kidney stone  EXAM: RENAL / URINARY TRACT ULTRASOUND COMPLETE  COMPARISON:  02/11/2016, CT 07/04/2015  FINDINGS: Right Kidney:  Length: 11.1 cm. Echogenicity within normal limits. No mass or hydronephrosis visualized. Probable shadowing stone in the midpole measuring 5 mm  Left Kidney:  Length: 11.9 cm. Echogenicity within normal limits. Mild fullness of the lower left renal pelvis. No definitive shadowing stones are visualized.  Bladder:  Appears normal for   degree of bladder distention. Prevoid bladder volume 136 mL. Postvoid bladder volume 21 mL.  IMPRESSION: 1. Mild enlargement of the left lower renal pelvis without frank hydronephrosis. Previously noted multiple shadowing stones are not clearly visualized. 2. Probable 5 mm shadowing stone in the mid right kidney. No hydronephrosis.   Electronically Signed By: Jasmine Pang M.D. On: 07/04/2016 17:24  No results found for this or any previous visit.  No results found for this or any previous visit.  Results for orders placed during the hospital encounter of 07/04/15  CT RENAL STONE STUDY  Narrative CLINICAL DATA:  40 year old female with history of bilateral flank pain since May 02, 2015. Has passed multiple stones since 2015/05/02. Persistent pain.  EXAM: CT ABDOMEN AND PELVIS WITHOUT CONTRAST  TECHNIQUE: Multidetector CT imaging of the abdomen and pelvis was performed following the standard  protocol without IV contrast.  COMPARISON:  CT the abdomen and pelvis 07/20/2012.  FINDINGS: Lower chest:  Unremarkable.  Hepatobiliary: No definite cystic or solid hepatic lesions on today's noncontrast CT examination. Unenhanced appearance of the gallbladder is normal.  Pancreas: No pancreatic mass. No pancreatic ductal dilatation. No pancreatic or peripancreatic fluid or inflammatory changes.  Spleen: Unremarkable.  Adrenals/Urinary Tract: There are numerous calculi present within the collecting systems of the kidneys bilaterally, the largest cluster of which is in the lower pole collecting system of the right kidney measuring up to 12 mm in diameter. No additional calculi are noted along the course of either ureter or within the lumen of the urinary bladder. No hydroureteronephrosis or perinephric stranding to suggest urinary tract obstruction Wilson this time. Unenhanced appearance of the kidneys and urinary bladder are otherwise unremarkable. Bilateral adrenal glands are normal in appearance.  Stomach/Bowel: Unenhanced appearance of the stomach is normal. No pathologic dilatation of small bowel or colon. Normal appendix.  Vascular/Lymphatic: No significant atherosclerotic calcifications or definite aneurysm identified in the abdominal or pelvic vasculature. No lymphadenopathy noted in the abdomen or pelvis.  Reproductive: Uterus is retroverted. Ovaries are unremarkable in appearance.  Other: No significant volume of ascites.  No pneumoperitoneum.  Musculoskeletal: There are no aggressive appearing lytic or blastic lesions noted in the visualized portions of the skeleton.  IMPRESSION: 1. Multiple nonobstructive calculi are noted in the collecting systems of the kidneys bilaterally, the largest cluster of which is in the lower pole collecting system of the right kidney measuring up to 12 mm in diameter. 2. No ureteral stones or findings of urinary tract obstruction  are noted Wilson this time. 3. Normal appendix.   Electronically Signed By: Trudie Reed M.D. On: 07/05/2015 08:02   Assessment & Plan:    1. Kidney stones -We discussed the management of kidney stones. These options include observation, ureteroscopy, shockwave lithotripsy (ESWL) and percutaneous nephrolithotomy (PCNL). We discussed which options are relevant to the patient's stone(s). We discussed the natural history of kidney stones as well as the complications of untreated stones and the impact on quality of life without treatment as well as with each of the above listed treatments. We also discussed the efficacy of each treatment in its ability to clear the stone burden. With any of these management options I discussed the signs and symptoms of infection and the need for emergent treatment should these be experienced. For each option we discussed the ability of each procedure to clear the patient of their stone burden.   For observation I described the risks which include but are not limited to silent renal damage, life-threatening  infection, need for emergent surgery, failure to pass stone and pain.   For ureteroscopy I described the risks which include bleeding, infection, damage to contiguous structures, positioning injury, ureteral stricture, ureteral avulsion, ureteral injury, need for prolonged ureteral stent, inability to perform ureteroscopy, need for an interval procedure, inability to clear stone burden, stent discomfort/pain, heart attack, stroke, pulmonary embolus and the inherent risks with general anesthesia.   For shockwave lithotripsy I described the risks which include arrhythmia, kidney contusion, kidney hemorrhage, need for transfusion, pain, inability to adequately break up stone, inability to pass stone fragments, Steinstrasse, infection associated with obstructing stones, need for alternate surgical procedure, need for repeat shockwave lithotripsy, MI, CVA, PE and the  inherent risks with anesthesia/conscious sedation.   For PCNL I described the risks including positioning injury, pneumothorax, hydrothorax, need for chest tube, inability to clear stone burden, renal laceration, arterial venous fistula or malformation, need for embolization of kidney, loss of kidney or renal function, need for repeat procedure, need for prolonged nephrostomy tube, ureteral avulsion, MI, CVA, PE and the inherent risks of general anesthesia.   - The patient would like to proceed with left ureteroscopic stone extraction followed by right ureteroscopic stone extraction - Urinalysis, Routine w reflex microscopic   No follow-ups on file.  Wilkie Aye, MD  [facility] [place]

## 2020-10-10 NOTE — Progress Notes (Signed)
Urological Symptom Review  Patient is experiencing the following symptoms: Burning/pain with urination Kidney Stones   Review of Systems  Gastrointestinal (upper)  : Nausea  Gastrointestinal (lower) : Diarrhea Constipation  Constitutional : Negative for symptoms  Skin: Negative for skin symptoms  Eyes: Negative for eye symptoms  Ear/Nose/Throat : Sinus problems  Hematologic/Lymphatic: Negative for Hematologic/Lymphatic symptoms  Cardiovascular : Negative for cardiovascular symptoms  Respiratory : Negative for respiratory symptoms  Endocrine: Negative for endocrine symptoms  Musculoskeletal: Negative for musculoskeletal symptoms  Neurological: Headaches  Psychologic: Depression Anxiety

## 2020-10-10 NOTE — Patient Instructions (Signed)
Ureteroscopy Ureteroscopy is a procedure to check for and treat problems inside part of the urinary tract. In this procedure, a thin, flexible tube with a light at the end (ureteroscope) is used to look at the inside of the kidneys and the ureters. The ureters are the tubes that carry urine from the kidneys to the bladder. The ureteroscope isinserted into one or both of the ureters. You may need this procedure if you have frequent urinary tract infections (UTIs), blood in your urine, or a stone in one of your ureters. A ureteroscopy can be done: To find the cause of urine blockage in a ureter and to evaluate other abnormalities inside the ureters or kidneys. To remove stones. To remove or treat growths of tissue (polyps), abnormal tissue, and some types of tumors. To remove a tissue sample and check it for disease under a microscope (biopsy). Tell a health care provider about: Any allergies you have. All medicines you are taking, including vitamins, herbs, eye drops, creams, and over-the-counter medicines. Any problems you or family members have had with anesthetic medicines. Any blood disorders you have. Any surgeries you have had. Any medical conditions you have. Whether you are pregnant or may be pregnant. What are the risks? Generally, this is a safe procedure. However, problems may occur, including: Bleeding. Infection. Allergic reactions to medicines. Scarring that narrows the ureter (stricture). Creating a hole in the ureter (perforation). What happens before the procedure? Staying hydrated Follow instructions from your health care provider about hydration, which may include: Up to 2 hours before the procedure - you may continue to drink clear liquids, such as water, clear fruit juice, black coffee, and plain tea.  Eating and drinking restrictions Follow instructions from your health care provider about eating and drinking, which may include: 8 hours before the procedure - stop  eating heavy meals or foods, such as meat, fried foods, or fatty foods. 6 hours before the procedure - stop eating light meals or foods, such as toast or cereal. 6 hours before the procedure - stop drinking milk or drinks that contain milk. 2 hours before the procedure - stop drinking clear liquids. Medicines Ask your health care provider about: Changing or stopping your regular medicines. This is especially important if you are taking diabetes medicines or blood thinners. Taking medicines such as aspirin and ibuprofen. These medicines can thin your blood. Do not take these medicines unless your health care provider tells you to take them. Taking over-the-counter medicines, vitamins, herbs, and supplements. General instructions Do not use any products that contain nicotine or tobacco for at least 4 weeks before the procedure. These products include cigarettes, e-cigarettes, and chewing tobacco. If you need help quitting, ask your health care provider. You may have a urine sample taken to check for infection. Plan to have someone take you home from the hospital or clinic. If you will be going home right after the procedure, plan to have someone with you for 24 hours. Ask your health care provider what steps will be taken to help prevent infection. These may include: Washing skin with a germ-killing soap. Receiving antibiotic medicine. What happens during the procedure?  An IV will be inserted into one of your veins. You will be given one or more of the following: A medicine to help you relax (sedative). A medicine to make you fall asleep (general anesthetic). A medicine that is injected into your spine to numb the area below and slightly above the injection site (spinal anesthetic). The part   of your body that drains urine from your bladder (urethra) will be cleaned with a germ-killing solution. The ureteroscope will be passed through your urethra into your bladder. A salt-water solution will  be sent through the ureteroscope to fill your bladder. This will help the health care provider see the openings of your ureters more clearly. The ureteroscope will be passed into your ureter. If a growth is found, a biopsy may be done. If a stone is found, it may be removed through the ureteroscope, or the stone may be broken up using a laser, shock waves, or electrical energy. In some cases, if the ureter is too small, a tube may be inserted that keeps the ureter open (ureteral stent). The stent may be left in place for 1 or 2 weeks to keep the ureter open, and then the ureteroscopy procedure will be done. The scope will be removed, and your bladder will be emptied. The procedure may vary among health care providers and hospitals. What can I expect after the procedure? After your procedure, it is common to have: Your blood pressure, heart rate, breathing rate, and blood oxygen level monitored until you leave the hospital or clinic. A burning sensation when you urinate. You may be asked to urinate. Blood in your urine. Mild discomfort in your bladder area or kidney area when urinating. A need to urinate more often or urgently. Follow these instructions at home: Medicines Take over-the-counter and prescription medicines only as told by your health care provider. If you were prescribed an antibiotic medicine, take it as told by your health care provider. Do not stop taking the antibiotic even if you start to feel better. General instructions  If you were given a sedative during the procedure, it can affect you for several hours. Do not drive or operate machinery until your health care provider says that it is safe. To relieve burning, take a warm bath or hold a warm washcloth over your groin. Drink enough fluid to keep your urine pale yellow. Drink two 8-ounce (237 mL) glasses of water every hour for the first 2 hours after you get home. Continue to drink water often at home. You can eat what  you normally do. Keep all follow-up visits as told by your health care provider. This is important. If you had a ureteral stent placed, ask your health care provider when you need to return to have it removed.  Contact a health care provider if you have: Chills or a fever. Burning pain for longer than 24 hours after the procedure. Blood in your urine for longer than 24 hours after the procedure. Get help right away if you have: Large amounts of blood in your urine. Blood clots in your urine. Severe pain. Chest pain or trouble breathing. The feeling of a full bladder and you are unable to urinate. These symptoms may represent a serious problem that is an emergency. Do not wait to see if the symptoms will go away. Get medical help right away. Call your local emergency services (911 in the [place] Summary Ureteroscopy is a procedure to check for and treat problems inside part of the urinary tract. In this procedure, a thin, flexible tube with a light at the end (ureteroscope) is used to look at the inside of the kidneys and the ureters. You may need this procedure if you have frequent urinary tract infections (UTIs), blood in your urine, or a stone in a ureter. This information is not intended to replace advice given to   you by your health care provider. Make sure you discuss any questions you have with your healthcare provider. Document Revised: 01/19/2019 Document Reviewed: 01/19/2019 Elsevier Patient Education  2022 [employer]  

## 2020-10-10 NOTE — H&P (View-Only) (Signed)
10/10/2020 10:55 AM   Stacey Wilson Jul 20, 1980 [id number]  Referring provider: Babs Sciara, MD [address] [place],  [place] [address]  nephrolithiasis   HPI: [name] is a 40yo here  for evaluation of nephrolithiasis. She was last seen in 06/2016. Starting last week she is having intermittent left flank pain that is sharp, moderate to severe and nonradiating. She was having associated nausea. She was having urinary urgency and frequency which is new. She passed 3 small calculi but her LUTS continue.   Here records from [facility] are as follows: I have kidney stones.  HPI: Stacey Wilson is a 40 year-old female established patient who is here for renal calculi.  The problem is on both sides. She first stated noticing pain on approximately 01/27/2016. This is not her first kidney stone. Her first stone was approximately 04/28/2006. She has had more than 5 stones prior to getting this one. She is not currently having flank pain, back pain, groin pain, nausea, vomiting, fever or chills. She has caught a stone in her urine strainer since her symptoms began.   She has had ureteral stent and ureteroscopy for treatment of her stones in the past.   05/07/2016: for the past 3 weeks she has been having intermittent right flank pain and has passed 4 small calculi. SHe underwent Renal US in 01/2016 which showed multiple bilateral renal calculi. She currently has urgency and a feeling of incomplete emptying.   06/04/2016: S/p b/l URS. Stone composition was 65% CaOX and 35% CaPhos   07/16/2016: S/p B/l URS. renal US shows no calculi. 24 hour urine shows low volume and hypercalcuria      PMH: Past Medical History:  Diagnosis Date   Anxiety    History of kidney stones    Renal calculi    bilateral   Urgency of urination    Warts, genital     Surgical History: Past Surgical History:  Procedure Laterality Date   BIOPSY  01/10/2020   Procedure: BIOPSY;  Surgeon: Dolores Frame,  MD;  Location: [facility];  Service: Gastroenterology;;   BREAST SURGERY Right 2002   benign cyst   COLONOSCOPY WITH PROPOFOL N/A 01/10/2020   Procedure: COLONOSCOPY WITH PROPOFOL;  Surgeon: Dolores Frame, MD;  Location: [facility];  Service: Gastroenterology;  Laterality: N/A;  730   CYSTOSCOPY W/ URETERAL STENT PLACEMENT Bilateral 05/19/2016   Procedure: CYSTOSCOPY WITH RETROGRADE PYELOGRAM/URETEROSCOPY/STONE EXTRACTION WITH BASKET / STENT PLACEMENT;  Surgeon: Malen Gauze, MD;  [facility];  Service: Urology;  Laterality: Bilateral;   CYSTOSCOPY WITH RETROGRADE PYELOGRAM, URETEROSCOPY AND STENT PLACEMENT Bilateral 07/25/2015   Procedure: CYSTOSCOPY WITH BILATERAL RETROGRADE PYELOGRAM, BILATERAL URETEROSCOPY AND BILATERAL STENT PLACEMENT;  Surgeon: Malen Gauze, MD;  Location: [facility];  Service: Urology;  Laterality: Bilateral;   STONE EXTRACTION WITH BASKET Bilateral 07/25/2015   Procedure: BILATERAL RENAL STONE EXTRACTION WITH BASKET;  Surgeon: Malen Gauze, MD;  Location: [facility];  Service: Urology;  Laterality: Bilateral;    Home Medications:  Allergies as of 10/10/2020       Reactions   Nystatin Itching        Medication List        Accurate as of October 10, 2020 10:55 AM. If you have any questions, ask your nurse or doctor.          amoxicillin-clavulanate 875-125 MG tablet Commonly known as: AUGMENTIN Take 1 tablet by mouth 2 (two) times daily.  clonazePAM 0.5 MG tablet Commonly known as: KLONOPIN Take 1 tablet (0.5 mg total) by mouth 2 (two) times daily as needed for anxiety.   fluticasone 50 MCG/ACT nasal spray Commonly known as: FLONASE PLACE 2 SPRAYS IN EACH NOSTRIL EACH DAY What changed:  how much to take how to take this when to take this additional instructions   Hyoscyamine Sulfate SL 0.125 MG Subl Commonly known as: Levsin/SL Place 0.125 mg under the tongue 3 (three) times daily as needed (abd  pain, diarrhea).   loratadine 10 MG tablet Commonly known as: CLARITIN Take 10 mg by mouth daily.   norethindrone 0.35 MG tablet Commonly known as: MICRONOR Take 1 tablet (0.35 mg total) by mouth daily.   senna 8.6 MG Tabs tablet Commonly known as: SENOKOT Take 1 tablet by mouth daily as needed for mild constipation.   vitamin C 100 MG tablet Take 100 mg by mouth daily.        Allergies:  Allergies  Allergen Reactions   Nystatin Itching    Family History: No family history on file.  Social History:  reports that she has been smoking cigarettes. She has a 7.50 pack-year smoking history. She has never used smokeless tobacco. She reports current alcohol use of about 1.0 standard drink of alcohol per week. She reports that she does not use drugs.  ROS: All other review of systems were reviewed and are negative except what is noted above in HPI  Physical Exam: BP 136/90   Pulse (!) 101   Ht 5\' 5"  (1.651 m)   Wt 211 lb (95.7 kg)   BMI 35.11 kg/m   Constitutional:  Alert and oriented, No acute distress. HEENT: [place] AT, moist mucus membranes.  Trachea midline, no masses. Cardiovascular: No clubbing, cyanosis, or edema. Respiratory: Normal respiratory effort, no increased work of breathing. GI: Abdomen is soft, nontender, nondistended, no abdominal masses GU: No CVA tenderness.  Lymph: No cervical or inguinal lymphadenopathy. Skin: No rashes, bruises or suspicious lesions. Neurologic: Grossly intact, no focal deficits, moving all 4 extremities. Psychiatric: Normal mood and affect.  Laboratory Data: Lab Results  Component Value Date   WBC 7.6 11/30/2019   HGB 13.9 11/30/2019   HCT 41.9 11/30/2019   MCV 92 11/30/2019   PLT 248 11/30/2019    Lab Results  Component Value Date   CREATININE 0.69 11/30/2019    No results found for: PSA  No results found for: TESTOSTERONE  No results found for: HGBA1C  Urinalysis    Component Value Date/Time   LEUKOCYTESUR  small (1+) (A) 06/27/2015 1457    No results found for: LABMICR, WBCUA, RBCUA, LABEPIT, MUCUS, BACTERIA  Pertinent Imaging: Ct stone study: Images reviewed and discussed with the patient No results found for this or any previous visit.  No results found for this or any previous visit.  No results found for this or any previous visit.  No results found for this or any previous visit.  Results for orders placed during the hospital encounter of 07/04/16  US Renal  Narrative CLINICAL DATA:  Follow-up kidney stone  EXAM: RENAL / URINARY TRACT ULTRASOUND COMPLETE  COMPARISON:  02/11/2016, CT 07/04/2015  FINDINGS: Right Kidney:  Length: 11.1 cm. Echogenicity within normal limits. No mass or hydronephrosis visualized. Probable shadowing stone in the midpole measuring 5 mm  Left Kidney:  Length: 11.9 cm. Echogenicity within normal limits. Mild fullness of the lower left renal pelvis. No definitive shadowing stones are visualized.  Bladder:  Appears normal for  degree of bladder distention. Prevoid bladder volume 136 mL. Postvoid bladder volume 21 mL.  IMPRESSION: 1. Mild enlargement of the left lower renal pelvis without frank hydronephrosis. Previously noted multiple shadowing stones are not clearly visualized. 2. Probable 5 mm shadowing stone in the mid right kidney. No hydronephrosis.   Electronically Signed By: Jasmine Pang M.D. On: 07/04/2016 17:24  No results found for this or any previous visit.  No results found for this or any previous visit.  Results for orders placed during the hospital encounter of 07/04/15  CT RENAL STONE STUDY  Narrative CLINICAL DATA:  40 year old female with history of bilateral flank pain since May 02, 2015. Has passed multiple stones since 2015/05/02. Persistent pain.  EXAM: CT ABDOMEN AND PELVIS WITHOUT CONTRAST  TECHNIQUE: Multidetector CT imaging of the abdomen and pelvis was performed following the standard  protocol without IV contrast.  COMPARISON:  CT the abdomen and pelvis 07/20/2012.  FINDINGS: Lower chest:  Unremarkable.  Hepatobiliary: No definite cystic or solid hepatic lesions on today's noncontrast CT examination. Unenhanced appearance of the gallbladder is normal.  Pancreas: No pancreatic mass. No pancreatic ductal dilatation. No pancreatic or peripancreatic fluid or inflammatory changes.  Spleen: Unremarkable.  Adrenals/Urinary Tract: There are numerous calculi present within the collecting systems of the kidneys bilaterally, the largest cluster of which is in the lower pole collecting system of the right kidney measuring up to 12 mm in diameter. No additional calculi are noted along the course of either ureter or within the lumen of the urinary bladder. No hydroureteronephrosis or perinephric stranding to suggest urinary tract obstruction at this time. Unenhanced appearance of the kidneys and urinary bladder are otherwise unremarkable. Bilateral adrenal glands are normal in appearance.  Stomach/Bowel: Unenhanced appearance of the stomach is normal. No pathologic dilatation of small bowel or colon. Normal appendix.  Vascular/Lymphatic: No significant atherosclerotic calcifications or definite aneurysm identified in the abdominal or pelvic vasculature. No lymphadenopathy noted in the abdomen or pelvis.  Reproductive: Uterus is retroverted. Ovaries are unremarkable in appearance.  Other: No significant volume of ascites.  No pneumoperitoneum.  Musculoskeletal: There are no aggressive appearing lytic or blastic lesions noted in the visualized portions of the skeleton.  IMPRESSION: 1. Multiple nonobstructive calculi are noted in the collecting systems of the kidneys bilaterally, the largest cluster of which is in the lower pole collecting system of the right kidney measuring up to 12 mm in diameter. 2. No ureteral stones or findings of urinary tract obstruction  are noted at this time. 3. Normal appendix.   Electronically Signed By: Trudie Reed M.D. On: 07/05/2015 08:02   Assessment & Plan:    1. Kidney stones -We discussed the management of kidney stones. These options include observation, ureteroscopy, shockwave lithotripsy (ESWL) and percutaneous nephrolithotomy (PCNL). We discussed which options are relevant to the patient's stone(s). We discussed the natural history of kidney stones as well as the complications of untreated stones and the impact on quality of life without treatment as well as with each of the above listed treatments. We also discussed the efficacy of each treatment in its ability to clear the stone burden. With any of these management options I discussed the signs and symptoms of infection and the need for emergent treatment should these be experienced. For each option we discussed the ability of each procedure to clear the patient of their stone burden.   For observation I described the risks which include but are not limited to silent renal damage, life-threatening  infection, need for emergent surgery, failure to pass stone and pain.   For ureteroscopy I described the risks which include bleeding, infection, damage to contiguous structures, positioning injury, ureteral stricture, ureteral avulsion, ureteral injury, need for prolonged ureteral stent, inability to perform ureteroscopy, need for an interval procedure, inability to clear stone burden, stent discomfort/pain, heart attack, stroke, pulmonary embolus and the inherent risks with general anesthesia.   For shockwave lithotripsy I described the risks which include arrhythmia, kidney contusion, kidney hemorrhage, need for transfusion, pain, inability to adequately break up stone, inability to pass stone fragments, Steinstrasse, infection associated with obstructing stones, need for alternate surgical procedure, need for repeat shockwave lithotripsy, MI, CVA, PE and the  inherent risks with anesthesia/conscious sedation.   For PCNL I described the risks including positioning injury, pneumothorax, hydrothorax, need for chest tube, inability to clear stone burden, renal laceration, arterial venous fistula or malformation, need for embolization of kidney, loss of kidney or renal function, need for repeat procedure, need for prolonged nephrostomy tube, ureteral avulsion, MI, CVA, PE and the inherent risks of general anesthesia.   - The patient would like to proceed with left ureteroscopic stone extraction followed by right ureteroscopic stone extraction - Urinalysis, Routine w reflex microscopic   No follow-ups on file.  Wilkie Aye, MD  [facility] [place]

## 2020-10-11 LAB — URINALYSIS, ROUTINE W REFLEX MICROSCOPIC
Bilirubin, UA: NEGATIVE
Glucose, UA: NEGATIVE
Ketones, UA: NEGATIVE
Leukocytes,UA: NEGATIVE
Nitrite, UA: NEGATIVE
Protein,UA: NEGATIVE
Specific Gravity, UA: 1.025 (ref 1.005–1.030)
Urobilinogen, Ur: 0.2 mg/dL (ref 0.2–1.0)
pH, UA: 6.5 (ref 5.0–7.5)

## 2020-10-11 LAB — MICROSCOPIC EXAMINATION: Bacteria, UA: NONE SEEN

## 2020-10-23 ENCOUNTER — Telehealth: Payer: Self-pay | Admitting: Urology

## 2020-10-23 NOTE — Telephone Encounter (Signed)
Patient left a message on 10/22/20 wanting to know the status of her FMLA paperwork. Patient stated that she had to have it done by 10/25/20. Please call patient when you can. Thanks!

## 2020-10-23 NOTE — Telephone Encounter (Signed)
Patient called left message of FMLA paperwork ready and at front desk.

## 2020-10-30 ENCOUNTER — Other Ambulatory Visit: Payer: Self-pay | Admitting: Adult Health

## 2020-11-02 NOTE — Patient Instructions (Signed)
Stacey Wilson  11/02/2020     @   Your procedure is scheduled on  11/07/2020.   Report to [facility] at  1100 A.M.  Call this number if you have problems the morning of surgery:  [phone]   Remember:  Do not eat or drink after midnight.      Take these medicines the morning of surgery with A SIP OF WATER           claritin.       Do not wear jewelry, make-up or nail polish.  Do not wear lotions, powders, or perfumes, or deodorant.  Do not shave 48 hours prior to surgery.  Men may shave face and neck.  Do not bring valuables to the hospital.  [facility] is not responsible for any belongings or valuables.  Contacts, dentures or bridgework may not be worn into surgery.  Leave your suitcase in the car.  After surgery it may be brought to your room.  For patients admitted to the hospital, discharge time will be determined by your treatment team.  Patients discharged the day of surgery will not be allowed to drive home and must have someone with them for 24 hours.    Special instructions:     DO NOT smoke tobacco or vape for 24 hours before your procedure.  Please read over the following fact sheets that you were given. Coughing and Deep Breathing, Surgical Site Infection Prevention, Anesthesia Post-op Instructions, and Care and Recovery After Surgery      Ureteral Stent Implantation, Care After This sheet gives you information about how to care for yourself after your procedure. Your health care provider may also give you more specific instructions. If you have problems or questions, contact your health careprovider. What can I expect after the procedure? After the procedure, it is common to have: Nausea. Mild pain when you urinate. You may feel this pain in your lower back or lower abdomen. The pain should stop within a few minutes after you urinate. This may last for up to 1 week. A small amount of blood in your urine for several  days. Follow these instructions at home: Medicines Take over-the-counter and prescription medicines only as told by your health care provider. If you were prescribed an antibiotic medicine, take it as told by your health care provider. Do not stop taking the antibiotic even if you start to feel better. Do not drive for 24 hours if you were given a sedative during your procedure. Ask your health care provider if the medicine prescribed to you requires you to avoid driving or using heavy machinery. Activity Rest as told by your health care provider. Avoid sitting for a long time without moving. Get up to take short walks every 1-2 hours. This is important to improve blood flow and breathing. Ask for help if you feel weak or unsteady. Return to your normal activities as told by your health care provider. Ask your health care provider what activities are safe for you. General instructions  Watch for any blood in your urine. Call your health care provider if the amount of blood in your urine increases. If you have a catheter: Follow instructions from your health care provider about taking care of your catheter and collection bag. Do not take baths, swim, or use a hot tub until your health care provider approves. Ask your health care provider if you may take showers. You may only be  allowed to take sponge baths. Drink enough fluid to keep your urine pale yellow. Do not use any products that contain nicotine or tobacco, such as cigarettes, e-cigarettes, and chewing tobacco. These can delay healing after surgery. If you need help quitting, ask your health care provider. Keep all follow-up visits as told by your health care provider. This is important.  Contact a health care provider if: You have pain that gets worse or does not get better with medicine, especially pain when you urinate. You have difficulty urinating. You feel nauseous or you vomit repeatedly during a period of more than 2 days after  the procedure. Get help right away if: Your urine is dark red or has blood clots in it. You are leaking urine (have incontinence). The end of the stent comes out of your urethra. You cannot urinate. You have sudden, sharp, or severe pain in your abdomen or lower back. You have a fever. You have swelling or pain in your legs. You have difficulty breathing. Summary After the procedure, it is common to have mild pain when you urinate that goes away within a few minutes after you urinate. This may last for up to 1 week. Watch for any blood in your urine. Call your health care provider if the amount of blood in your urine increases. Take over-the-counter and prescription medicines only as told by your health care provider. Drink enough fluid to keep your urine pale yellow. This information is not intended to replace advice given to you by your health care provider. Make sure you discuss any questions you have with your healthcare provider. Document Revised: 01/19/2018 Document Reviewed: 01/20/2018 Elsevier Patient Education  2022 [employer] General Anesthesia, Adult, Care After This sheet gives you information about how to care for yourself after your procedure. Your health care provider may also give you more specific instructions. If you have problems or questions, contact your health careprovider. What can I expect after the procedure? After the procedure, the following side effects are common: Pain or discomfort at the IV site. Nausea. Vomiting. Sore throat. Trouble concentrating. Feeling cold or chills. Feeling weak or tired. Sleepiness and fatigue. Soreness and body aches. These side effects can affect parts of the body that were not involved in surgery. Follow these instructions at home: For the time period you were told by your health care provider:  Rest. Do not participate in activities where you could fall or become injured. Do not drive or use machinery. Do not drink  alcohol. Do not take sleeping pills or medicines that cause drowsiness. Do not make important decisions or sign legal documents. Do not take care of children on your own.  Eating and drinking Follow any instructions from your health care provider about eating or drinking restrictions. When you feel hungry, start by eating small amounts of foods that are soft and easy to digest (bland), such as toast. Gradually return to your regular diet. Drink enough fluid to keep your urine pale yellow. If you vomit, rehydrate by drinking water, juice, or clear broth. General instructions If you have sleep apnea, surgery and certain medicines can increase your risk for breathing problems. Follow instructions from your health care provider about wearing your sleep device: Anytime you are sleeping, including during daytime naps. While taking prescription pain medicines, sleeping medicines, or medicines that make you drowsy. Have a responsible adult stay with you for the time you are told. It is important to have someone help care for you until you are  awake and alert. Return to your normal activities as told by your health care provider. Ask your health care provider what activities are safe for you. Take over-the-counter and prescription medicines only as told by your health care provider. If you smoke, do not smoke without supervision. Keep all follow-up visits as told by your health care provider. This is important. Contact a health care provider if: You have nausea or vomiting that does not get better with medicine. You cannot eat or drink without vomiting. You have pain that does not get better with medicine. You are unable to pass urine. You develop a skin rash. You have a fever. You have redness around your IV site that gets worse. Get help right away if: You have difficulty breathing. You have chest pain. You have blood in your urine or stool, or you vomit blood. Summary After the procedure,  it is common to have a sore throat or nausea. It is also common to feel tired. Have a responsible adult stay with you for the time you are told. It is important to have someone help care for you until you are awake and alert. When you feel hungry, start by eating small amounts of foods that are soft and easy to digest (bland), such as toast. Gradually return to your regular diet. Drink enough fluid to keep your urine pale yellow. Return to your normal activities as told by your health care provider. Ask your health care provider what activities are safe for you. This information is not intended to replace advice given to you by your health care provider. Make sure you discuss any questions you have with your healthcare provider. Document Revised: 12/29/2019 Document Reviewed: 07/28/2019 Elsevier Patient Education  2022 Else[employer] Inc. How to Use Chlorhexidine for Bathing Chlorhexidine gluconate (CHG) is a germ-killing (antiseptic) solution that is used to clean the skin. It can get rid of the bacteria that normally live on the skin and can keep them away for about 24 hours. To clean your skin with CHG, you may be given: A CHG solution to use in the shower or as part of a sponge bath. A prepackaged cloth that contains CHG. Cleaning your skin with CHG may help lower the risk for infection: While you are staying in the intensive care unit of the hospital. If you have a vascular access, such as a central line, to provide short-term or long-term access to your veins. If you have a catheter to drain urine from your bladder. If you are on a ventilator. A ventilator is a machine that helps you breathe by moving air in and out of your lungs. After surgery. What are the risks? Risks of using CHG include: A skin reaction. Hearing loss, if CHG gets in your ears. Eye injury, if CHG gets in your eyes and is not rinsed out. The CHG product catching fire. Make sure that you avoid smoking and flames after  applying CHG to your skin. Do not use CHG: If you have a chlorhexidine allergy or have previously reacted to chlorhexidine. On babies younger than 46 months of age. How to use CHG solution Use CHG only as told by your health care provider, and follow the instructions on the label. Use the full amount of CHG as directed. Usually, this is one bottle. During a shower Follow these steps when using CHG solution during a shower (unless your health care provider gives you different instructions): Start the shower. Use your normal soap and shampoo to wash your face  and hair. Turn off the shower or move out of the shower stream. Pour the CHG onto a clean washcloth. Do not use any type of brush or rough-edged sponge. Starting at your neck, lather your body down to your toes. Make sure you follow these instructions: If you will be having surgery, pay special attention to the part of your body where you will be having surgery. Scrub this area for at least 1 minute. Do not use CHG on your head or face. If the solution gets into your ears or eyes, rinse them well with water. Avoid your genital area. Avoid any areas of skin that have broken skin, cuts, or scrapes. Scrub your back and under your arms. Make sure to wash skin folds. Let the lather sit on your skin for 1-2 minutes or as long as told by your health care provider. Thoroughly rinse your entire body in the shower. Make sure that all body creases and crevices are rinsed well. Dry off with a clean towel. Do not put any substances on your body afterward--such as powder, lotion, or perfume--unless you are told to do so by your health care provider. Only use lotions that are recommended by the manufacturer. Put on clean clothes or pajamas. If it is the night before your surgery, sleep in clean sheets.  During a sponge bath Follow these steps when using CHG solution during a sponge bath (unless your health care provider gives you different  instructions): Use your normal soap and shampoo to wash your face and hair. Pour the CHG onto a clean washcloth. Starting at your neck, lather your body down to your toes. Make sure you follow these instructions: If you will be having surgery, pay special attention to the part of your body where you will be having surgery. Scrub this area for at least 1 minute. Do not use CHG on your head or face. If the solution gets into your ears or eyes, rinse them well with water. Avoid your genital area. Avoid any areas of skin that have broken skin, cuts, or scrapes. Scrub your back and under your arms. Make sure to wash skin folds. Let the lather sit on your skin for 1-2 minutes or as long as told by your health care provider. Using a different clean, wet washcloth, thoroughly rinse your entire body. Make sure that all body creases and crevices are rinsed well. Dry off with a clean towel. Do not put any substances on your body afterward--such as powder, lotion, or perfume--unless you are told to do so by your health care provider. Only use lotions that are recommended by the manufacturer. Put on clean clothes or pajamas. If it is the night before your surgery, sleep in clean sheets. How to use CHG prepackaged cloths Only use CHG cloths as told by your health care provider, and follow the instructions on the label. Use the CHG cloth on clean, dry skin. Do not use the CHG cloth on your head or face unless your health care provider tells you to. When washing with the CHG cloth: Avoid your genital area. Avoid any areas of skin that have broken skin, cuts, or scrapes. Before surgery Follow these steps when using a CHG cloth to clean before surgery (unless your health care provider gives you different instructions): Using the CHG cloth, vigorously scrub the part of your body where you will be having surgery. Scrub using a back-and-forth motion for 3 minutes. The area on your body should be completely wet  with  CHG when you are done scrubbing. Do not rinse. Discard the cloth and let the area air-dry. Do not put any substances on the area afterward, such as powder, lotion, or perfume. Put on clean clothes or pajamas. If it is the night before your surgery, sleep in clean sheets.  For general bathing Follow these steps when using CHG cloths for general bathing (unless your health care provider gives you different instructions). Use a separate CHG cloth for each area of your body. Make sure you wash between any folds of skin and between your fingers and toes. Wash your body in the following order, switching to a new cloth after each step: The front of your neck, shoulders, and chest. Both of your arms, under your arms, and your hands. Your stomach and groin area, avoiding the genitals. Your right leg and foot. Your left leg and foot. The back of your neck, your back, and your buttocks. Do not rinse. Discard the cloth and let the area air-dry. Do not put any substances on your body afterward--such as powder, lotion, or perfume--unless you are told to do so by your health care provider. Only use lotions that are recommended by the manufacturer. Put on clean clothes or pajamas. Contact a health care provider if: Your skin gets irritated after scrubbing. You have questions about using your solution or cloth. Get help right away if: Your eyes become very red or swollen. Your eyes itch badly. Your skin itches badly and is red or swollen. Your hearing changes. You have trouble seeing. You have swelling or tingling in your mouth or throat. You have trouble breathing. You swallow any chlorhexidine. Summary Chlorhexidine gluconate (CHG) is a germ-killing (antiseptic) solution that is used to clean the skin. Cleaning your skin with CHG may help to lower your risk for infection. You may be given CHG to use for bathing. It may be in a bottle or in a prepackaged cloth to use on your skin. Carefully follow  your health care provider's instructions and the instructions on the product label. Do not use CHG if you have a chlorhexidine allergy. Contact your health care provider if your skin gets irritated after scrubbing. This information is not intended to replace advice given to you by your health care provider. Make sure you discuss any questions you have with your healthcare provider. Document Revised: 08/26/2019 Document Reviewed: 09/30/2019 Elsevier Patient Education  2022 [employer].

## 2020-11-05 ENCOUNTER — Encounter (HOSPITAL_COMMUNITY)
Admission: RE | Admit: 2020-11-05 | Discharge: 2020-11-05 | Disposition: A | Discharge status: Home / Self Care | Payer: Managed Care, Other (non HMO) | Source: Ambulatory Visit | Attending: Urology | Admitting: Urology

## 2020-11-05 ENCOUNTER — Other Ambulatory Visit: Payer: Self-pay

## 2020-11-05 ENCOUNTER — Encounter (HOSPITAL_COMMUNITY): Payer: Self-pay

## 2020-11-05 DIAGNOSIS — Z01812 Encounter for preprocedural laboratory examination: Secondary | ICD-10-CM | POA: Diagnosis present

## 2020-11-05 LAB — PREGNANCY, URINE: Preg Test, Ur: NEGATIVE

## 2020-11-07 ENCOUNTER — Ambulatory Visit (HOSPITAL_COMMUNITY)
Admission: RE | Admit: 2020-11-07 | Discharge: 2020-11-07 | Disposition: A | Discharge status: Home / Self Care | Payer: Managed Care, Other (non HMO) | Attending: Urology | Admitting: Urology

## 2020-11-07 ENCOUNTER — Encounter (HOSPITAL_COMMUNITY): Payer: Self-pay | Admitting: Urology

## 2020-11-07 ENCOUNTER — Ambulatory Visit (HOSPITAL_COMMUNITY): Payer: Managed Care, Other (non HMO)

## 2020-11-07 ENCOUNTER — Ambulatory Visit (HOSPITAL_COMMUNITY): Payer: Managed Care, Other (non HMO) | Admitting: Certified Registered"

## 2020-11-07 ENCOUNTER — Encounter (HOSPITAL_COMMUNITY)
Admission: RE | Disposition: A | Discharge status: Home / Self Care | Payer: Self-pay | Source: Home / Self Care | Attending: Urology

## 2020-11-07 DIAGNOSIS — Z888 Allergy status to other drugs, medicaments and biological substances status: Secondary | ICD-10-CM | POA: Diagnosis not present

## 2020-11-07 DIAGNOSIS — F17210 Nicotine dependence, cigarettes, uncomplicated: Secondary | ICD-10-CM | POA: Insufficient documentation

## 2020-11-07 DIAGNOSIS — Z87442 Personal history of urinary calculi: Secondary | ICD-10-CM | POA: Insufficient documentation

## 2020-11-07 DIAGNOSIS — N200 Calculus of kidney: Secondary | ICD-10-CM | POA: Diagnosis present

## 2020-11-07 DIAGNOSIS — Z79899 Other long term (current) drug therapy: Secondary | ICD-10-CM | POA: Diagnosis not present

## 2020-11-07 HISTORY — PX: CYSTOSCOPY WITH RETROGRADE PYELOGRAM, URETEROSCOPY AND STENT PLACEMENT: SHX5789

## 2020-11-07 SURGERY — CYSTOURETEROSCOPY, WITH RETROGRADE PYELOGRAM AND STENT INSERTION
Anesthesia: General | Laterality: Left

## 2020-11-07 MED ORDER — GLYCOPYRROLATE PF 0.2 MG/ML IJ SOSY
PREFILLED_SYRINGE | INTRAMUSCULAR | Status: DC | PRN
  Administered 2020-11-07: .1 mg via INTRAVENOUS

## 2020-11-07 MED ORDER — FENTANYL CITRATE (PF) 100 MCG/2ML IJ SOLN
INTRAMUSCULAR | Status: AC
  Filled 2020-11-07: qty 2

## 2020-11-07 MED ORDER — WATER FOR IRRIGATION, STERILE IR SOLN
Status: DC | PRN
  Administered 2020-11-07: 1000 mL

## 2020-11-07 MED ORDER — KETOROLAC TROMETHAMINE 30 MG/ML IJ SOLN
INTRAMUSCULAR | Status: AC
  Filled 2020-11-07: qty 1

## 2020-11-07 MED ORDER — MIDAZOLAM HCL 2 MG/2ML IJ SOLN
INTRAMUSCULAR | Status: AC
  Filled 2020-11-07: qty 2

## 2020-11-07 MED ORDER — DIATRIZOATE MEGLUMINE 30 % UR SOLN
URETHRAL | Status: DC | PRN
  Administered 2020-11-07: 8 mL via URETHRAL

## 2020-11-07 MED ORDER — SEVOFLURANE IN SOLN
RESPIRATORY_TRACT | Status: AC
  Filled 2020-11-07: qty 250

## 2020-11-07 MED ORDER — LIDOCAINE 2% (20 MG/ML) 5 ML SYRINGE
INTRAMUSCULAR | Status: DC | PRN
  Administered 2020-11-07: 100 mg via INTRAVENOUS

## 2020-11-07 MED ORDER — MIDAZOLAM HCL 5 MG/5ML IJ SOLN
INTRAMUSCULAR | Status: DC | PRN
  Administered 2020-11-07: 2 mg via INTRAVENOUS

## 2020-11-07 MED ORDER — DIATRIZOATE MEGLUMINE 30 % UR SOLN
URETHRAL | Status: AC
  Filled 2020-11-07: qty 100

## 2020-11-07 MED ORDER — FENTANYL CITRATE (PF) 100 MCG/2ML IJ SOLN
25.0000 ug | INTRAMUSCULAR | Status: DC | PRN
  Administered 2020-11-07: 50 ug via INTRAVENOUS
  Filled 2020-11-07: qty 2

## 2020-11-07 MED ORDER — PROPOFOL 10 MG/ML IV BOLUS
INTRAVENOUS | Status: DC | PRN
  Administered 2020-11-07: 200 mg via INTRAVENOUS

## 2020-11-07 MED ORDER — KETOROLAC TROMETHAMINE 30 MG/ML IJ SOLN
INTRAMUSCULAR | Status: DC | PRN
  Administered 2020-11-07: 30 mg via INTRAVENOUS

## 2020-11-07 MED ORDER — ORAL CARE MOUTH RINSE: 15.0000 mL | Freq: Once | OROMUCOSAL | Status: AC

## 2020-11-07 MED ORDER — DEXAMETHASONE SODIUM PHOSPHATE 4 MG/ML IJ SOLN
INTRAMUSCULAR | Status: DC | PRN
  Administered 2020-11-07: 8 mg via INTRAVENOUS

## 2020-11-07 MED ORDER — FENTANYL CITRATE (PF) 100 MCG/2ML IJ SOLN
INTRAMUSCULAR | Status: DC | PRN
  Administered 2020-11-07 (×2): 50 ug via INTRAVENOUS

## 2020-11-07 MED ORDER — LACTATED RINGERS IV SOLN: INTRAVENOUS | Status: DC

## 2020-11-07 MED ORDER — CEFAZOLIN SODIUM-DEXTROSE 2-4 GM/100ML-% IV SOLN
2.0000 g | INTRAVENOUS | Status: AC
  Administered 2020-11-07: 2 g via INTRAVENOUS
  Filled 2020-11-07: qty 100

## 2020-11-07 MED ORDER — ONDANSETRON HCL 4 MG/2ML IJ SOLN: 4.0000 mg | Freq: Once | INTRAMUSCULAR | Status: DC | PRN

## 2020-11-07 MED ORDER — ONDANSETRON HCL 4 MG/2ML IJ SOLN
INTRAMUSCULAR | Status: DC | PRN
  Administered 2020-11-07: 4 mg via INTRAVENOUS

## 2020-11-07 MED ORDER — DEXMEDETOMIDINE (PRECEDEX) IN NS 20 MCG/5ML (4 MCG/ML) IV SYRINGE
PREFILLED_SYRINGE | INTRAVENOUS | Status: DC | PRN
  Administered 2020-11-07: 20 ug via INTRAVENOUS

## 2020-11-07 MED ORDER — CHLORHEXIDINE GLUCONATE 0.12 % MT SOLN
15.0000 mL | Freq: Once | OROMUCOSAL | Status: AC
  Administered 2020-11-07: 15 mL via OROMUCOSAL

## 2020-11-07 MED ORDER — OXYCODONE-ACETAMINOPHEN 5-325 MG PO TABS: 1.0000 | ORAL_TABLET | ORAL | 0 refills | Status: DC | PRN

## 2020-11-07 MED ORDER — SODIUM CHLORIDE 0.9 % IR SOLN
Status: DC | PRN
  Administered 2020-11-07: 6000 mL via INTRAVESICAL

## 2020-11-07 MED ORDER — CHLORHEXIDINE GLUCONATE 0.12 % MT SOLN
OROMUCOSAL | Status: AC
  Filled 2020-11-07: qty 15

## 2020-11-07 MED ORDER — ONDANSETRON HCL 4 MG PO TABS: 4.0000 mg | ORAL_TABLET | Freq: Every day | ORAL | 1 refills | Status: DC | PRN

## 2020-11-07 MED ORDER — PROPOFOL 10 MG/ML IV BOLUS
INTRAVENOUS | Status: AC
  Filled 2020-11-07: qty 20

## 2020-11-07 SURGICAL SUPPLY — 24 items
BAG DRAIN URO TABLE W/ADPT NS (BAG) ×2 IMPLANT
BAG DRN 8 ADPR NS SKTRN CSTL (BAG) ×1
BAG HAMPER (MISCELLANEOUS) ×2 IMPLANT
CATH INTERMIT  6FR 70CM (CATHETERS) ×2 IMPLANT
CLOTH BEACON ORANGE TIMEOUT ST (SAFETY) ×2 IMPLANT
DECANTER SPIKE VIAL GLASS SM (MISCELLANEOUS) ×2 IMPLANT
EXTRACTOR STONE NITINOL NGAGE (UROLOGICAL SUPPLIES) ×2 IMPLANT
GLOVE BIO SURGEON STRL SZ8 (GLOVE) ×2 IMPLANT
GLOVE SURG UNDER POLY LF SZ7 (GLOVE) ×4 IMPLANT
GOWN STRL REUS W/TWL LRG LVL3 (GOWN DISPOSABLE) ×2 IMPLANT
GOWN STRL REUS W/TWL XL LVL3 (GOWN DISPOSABLE) ×2 IMPLANT
GUIDEWIRE STR DUAL SENSOR (WIRE) ×2 IMPLANT
GUIDEWIRE STR ZIPWIRE 035X150 (MISCELLANEOUS) ×2 IMPLANT
IV NS IRRIG 3000ML ARTHROMATIC (IV SOLUTION) ×4 IMPLANT
KIT TURNOVER CYSTO (KITS) ×2 IMPLANT
MANIFOLD NEPTUNE II (INSTRUMENTS) ×2 IMPLANT
PACK CYSTO (CUSTOM PROCEDURE TRAY) ×2 IMPLANT
PAD ARMBOARD 7.5X6 YLW CONV (MISCELLANEOUS) ×2 IMPLANT
SHEATH URETERAL 12FRX35CM (MISCELLANEOUS) ×2 IMPLANT
STENT URET 6FRX24 CONTOUR (STENTS) ×2 IMPLANT
SYR 10ML LL (SYRINGE) ×2 IMPLANT
SYR CONTROL 10ML LL (SYRINGE) ×2 IMPLANT
TOWEL OR 17X26 4PK STRL BLUE (TOWEL DISPOSABLE) ×2 IMPLANT
WATER STERILE IRR 500ML POUR (IV SOLUTION) ×2 IMPLANT

## 2020-11-07 NOTE — Anesthesia Procedure Notes (Signed)
Procedure Name: LMA Insertion Date/Time: 11/07/2020 12:58 PM Performed by: Julian Reil, CRNA Pre-anesthesia Checklist: Patient identified, Emergency Drugs available, Suction available and Patient being monitored Patient Re-evaluated:Patient Re-evaluated prior to induction Oxygen Delivery Method: Circle system utilized Preoxygenation: Pre-oxygenation with 100% oxygen Induction Type: IV induction LMA: LMA inserted LMA Size: 4.0 Tube type: Oral Number of attempts: 1 Placement Confirmation: positive ETCO2 Tube secured with: Tape Dental Injury: Teeth and Oropharynx as per pre-operative assessment

## 2020-11-07 NOTE — Anesthesia Preprocedure Evaluation (Addendum)
Anesthesia Evaluation  Patient identified by MRN, date of birth, ID band Patient awake    Reviewed: Allergy & Precautions, H&P , NPO status , Patient's Chart, lab work & pertinent test results, reviewed documented beta blocker date and time   Airway Mallampati: II  TM Distance: >3 FB Neck ROM: full    Dental no notable dental hx.    Pulmonary neg pulmonary ROS, Current Smoker and Patient abstained from smoking.,    Pulmonary exam normal breath sounds clear to auscultation       Cardiovascular Exercise Tolerance: Good negative cardio ROS   Rhythm:regular Rate:Normal     Neuro/Psych PSYCHIATRIC DISORDERS Anxiety negative neurological ROS     GI/Hepatic negative GI ROS, Neg liver ROS,   Endo/Other  negative endocrine ROS  Renal/GU negative Renal ROS  negative genitourinary   Musculoskeletal   Abdominal   Peds  Hematology negative hematology ROS (+)   Anesthesia Other Findings   Reproductive/Obstetrics negative OB ROS                             Anesthesia Physical Anesthesia Plan  ASA: 1  Anesthesia Plan: General   Post-op Pain Management:    Induction:   PONV Risk Score and Plan: Ondansetron  Airway Management Planned:   Additional Equipment:   Intra-op Plan:   Post-operative Plan:   Informed Consent: I have reviewed the patients History and Physical, chart, labs and discussed the procedure including the risks, benefits and alternatives for the proposed anesthesia with the patient or authorized representative who has indicated his/her understanding and acceptance.     Dental Advisory Given  Plan Discussed with: CRNA  Anesthesia Plan Comments:        Anesthesia Quick Evaluation

## 2020-11-07 NOTE — Op Note (Signed)
.  Preoperative diagnosis: Left renal calculi  Postoperative diagnosis: Same  Procedure: 1 cystoscopy 2. Left retrograde pyelography 3.  Intraoperative fluoroscopy, under one hour, with interpretation 4.  Left ureteroscopic stone manipulation with basket extraction 5.  Left 6 x 26 JJ stent placement  Attending: Cleda Mccreedy  Anesthesia: General  Estimated blood loss: None  Drains: Left 6 x 26 JJ ureteral stent with tether  Specimens: stone for analysis  Antibiotics: ancef  Findings: left lower pole stone embedded in renal parenchyma with tortuous friable vessels over calculi Upper pole calculus embedded in renal parenchyma successfully removed. No hydronephrosis. No masses/lesions in the bladder. Ureteral orifices in normal anatomic location.  Indications: Patient is a 40 year old female with a history of left renal stone and who has persistent left flank pain.  After discussing treatment options, she decided proceed with left ureteroscopic stone manipulation.  Procedure her in detail: The patient was brought to the operating room and a brief timeout was done to ensure correct patient, correct procedure, correct site.  General anesthesia was administered patient was placed in dorsal lithotomy position.  Her genitalia was then prepped and draped in usual sterile fashion.  A rigid 22 French cystoscope was passed in the urethra and the bladder.  Bladder was inspected free masses or lesions.  the ureteral orifices were in the normal orthotopic locations.  a 6 french ureteral catheter was then instilled into the left ureteral orifice.  a gentle retrograde was obtained and findings noted above.  we then placed a zip wire through the ureteral catheter and advanced up to the renal pelvis.  we then removed the cystoscope and cannulated the left ureteral orifice with a semirigid ureteroscope.  No stone was found in the ureter. Once we reached the UPJ a sensor wire was advanced in to the renal  pelvis. We then removed the ureteroscope and advanced am 12/14 x 35cm access sheath up to the renal pelvis. We then used the flexible ureteroscope to perform nephroscopy. We encountered the stone in the lower pole which were embedded under friable renal parenchyma. We then located an embedded calculus in the upper pole which was removed with an NGage basket. We then removed the access sheath under direct vision and noted no injury to the ureter. We then placed a 6 x 26 double-j ureteral stent over the original zip wire.  We then removed the wire and good coil was noted in the the renal pelvis under fluoroscopy and the bladder under direct vision. the bladder was then drained and this concluded the procedure which was well tolerated by patient.  Complications: None  Condition: Stable, extubated, transferred to PACU  Plan: Patient is to be discharged home as to follow-up in one week. She is to remove her stent in 72 hours by gently pulling the tehter

## 2020-11-07 NOTE — Anesthesia Postprocedure Evaluation (Signed)
Anesthesia Post Note  Patient: Domenique C Dippolito  Procedure(s) Performed: CYSTOSCOPY WITH RETROGRADE PYELOGRAM, URETEROSCOPY AND STENT PLACEMENT (Left)  Patient location during evaluation: Phase II Anesthesia Type: General Level of consciousness: awake Pain management: pain level controlled Vital Signs Assessment: post-procedure vital signs reviewed and stable Respiratory status: spontaneous breathing and respiratory function stable Cardiovascular status: blood pressure returned to baseline and stable Postop Assessment: no headache and no apparent nausea or vomiting Anesthetic complications: no Comments: Late entry   No notable events documented.   Last Vitals:  Vitals:   11/07/20 1422 11/07/20 1439  BP:  (!) 140/96  Pulse: 76 73  Resp: 16 18  Temp:  36.5 C  SpO2: 95% 100%    Last Pain:  Vitals:   11/07/20 1439  TempSrc: Oral  PainSc: 1                  Windell Norfolk

## 2020-11-07 NOTE — Interval H&P Note (Signed)
History and Physical Interval Note:  11/07/2020 12:22 PM  Stacey Wilson  has presented today for surgery, with the diagnosis of left renal calculus.  The various methods of treatment have been discussed with the patient and family. After consideration of risks, benefits and other options for treatment, the patient has consented to  Procedure(s): CYSTOSCOPY WITH RETROGRADE PYELOGRAM, URETEROSCOPY AND STENT PLACEMENT (Left) HOLMIUM LASER APPLICATION (Left) as a surgical intervention.  The patient's history has been reviewed, patient examined, no change in status, stable for surgery.  I have reviewed the patient's chart and labs.  Questions were answered to the patient's satisfaction.     Wilkie Aye

## 2020-11-07 NOTE — Transfer of Care (Signed)
Immediate Anesthesia Transfer of Care Note  Patient: Stacey Wilson  Procedure(s) Performed: CYSTOSCOPY WITH RETROGRADE PYELOGRAM, URETEROSCOPY AND STENT PLACEMENT (Left) HOLMIUM LASER APPLICATION (Left)  Patient Location: PACU  Anesthesia Type:General  Level of Consciousness: awake, alert  and oriented  Airway & Oxygen Therapy: Patient Spontanous Breathing and Patient connected to face mask oxygen  Post-op Assessment: Report given to RN and Post -op Vital signs reviewed and stable  Post vital signs: Reviewed and stable  Last Vitals:  Vitals Value Taken Time  BP 135/86 11/07/20 1348  Temp    Pulse 93 11/07/20 1349  Resp 17 11/07/20 1349  SpO2 100 % 11/07/20 1349  Vitals shown include unvalidated device data.  Last Pain:  Vitals:   11/07/20 1113  TempSrc: Oral  PainSc: 0-No pain         Complications: No notable events documented.

## 2020-11-12 LAB — CALCULI, WITH PHOTOGRAPH (CLINICAL LAB)
Calcium Oxalate Dihydrate: 10 %
Calcium Oxalate Monohydrate: 50 %
Hydroxyapatite: 40 %
Weight Calculi: 36 mg

## 2020-11-13 ENCOUNTER — Encounter (HOSPITAL_COMMUNITY): Payer: Self-pay | Admitting: Urology

## 2020-11-16 ENCOUNTER — Telehealth (INDEPENDENT_AMBULATORY_CARE_PROVIDER_SITE_OTHER): Payer: Managed Care, Other (non HMO) | Admitting: Urology

## 2020-11-16 ENCOUNTER — Other Ambulatory Visit: Payer: Self-pay

## 2020-11-16 DIAGNOSIS — N200 Calculus of kidney: Secondary | ICD-10-CM

## 2020-11-20 ENCOUNTER — Encounter: Payer: Self-pay | Admitting: Urology

## 2020-11-27 NOTE — Progress Notes (Signed)
Pt unable to be reached via phone. Voice mail left to call the office

## 2020-12-04 ENCOUNTER — Encounter: Payer: Self-pay | Admitting: Urology

## 2020-12-04 ENCOUNTER — Telehealth (INDEPENDENT_AMBULATORY_CARE_PROVIDER_SITE_OTHER): Payer: Managed Care, Other (non HMO) | Admitting: Urology

## 2020-12-04 ENCOUNTER — Other Ambulatory Visit: Payer: Self-pay

## 2020-12-04 DIAGNOSIS — N200 Calculus of kidney: Secondary | ICD-10-CM | POA: Diagnosis not present

## 2020-12-04 NOTE — Patient Instructions (Signed)
Textbook of Natural Medicine (5th ed., pp. 1518-1527.e3). [place], [place]: Elsevier.">  Dietary Guidelines to Help Prevent Kidney Stones Kidney stones are deposits of minerals and salts that form inside your kidneys. Your risk of developing kidney stones may be greater depending on your diet, your lifestyle, the medicines you take, and whether you have certain medical conditions. Most people can lower their chances of developing kidney stones by following the instructions below. Your dietitian may give you more specific instructions depending on your overall health and the type of kidney stones youtend to develop. What are tips for following this plan? Reading food labels  Choose foods with "no salt added" or "low-salt" labels. Limit your salt (sodium) intake to less than 1,500 mg a day. Choose foods with calcium for each meal and snack. Try to eat about 300 mg of calcium at each meal. Foods that contain 200-500 mg of calcium a serving include: 8 oz (237 mL) of milk, calcium-fortifiednon-dairy milk, and calcium-fortifiedfruit juice. Calcium-fortified means that calcium has been added to these drinks. 8 oz (237 mL) of kefir, yogurt, and soy yogurt. 4 oz (114 g) of tofu. 1 oz (28 g) of cheese. 1 cup (150 g) of dried figs. 1 cup (91 g) of cooked broccoli. One 3 oz (85 g) can of sardines or mackerel. Most people need 1,000-1,500 mg of calcium a day. Talk to your dietitian abouthow much calcium is recommended for you. Shopping Buy plenty of fresh fruits and vegetables. Most people do not need to avoid fruits and vegetables, even if these foods contain nutrients that may contribute to kidney stones. When shopping for convenience foods, choose: Whole pieces of fruit. Pre-made salads with dressing on the side. Low-fat fruit and yogurt smoothies. Avoid buying frozen meals or prepared deli foods. These can be high in sodium. Look for foods with live cultures, such as yogurt and kefir. Choose high-fiber  grains, such as whole-wheat breads, oat bran, and wheat cereals. Cooking Do not add salt to food when cooking. Place a salt shaker on the table and allow each person to add his or her own salt to taste. Use vegetable protein, such as beans, textured vegetable protein (TVP), or tofu, instead of meat in pasta, casseroles, and soups. Meal planning Eat less salt, if told by your dietitian. To do this: Avoid eating processed or pre-made food. Avoid eating fast food. Eat less animal protein, including cheese, meat, poultry, or fish, if told by your dietitian. To do this: Limit the number of times you have meat, poultry, fish, or cheese each week. Eat a diet free of meat at least 2 days a week. Eat only one serving each day of meat, poultry, fish, or seafood. When you prepare animal protein, cut pieces into small portion sizes. For most meat and fish, one serving is about the size of the palm of your hand. Eat at least five servings of fresh fruits and vegetables each day. To do this: Keep fruits and vegetables on hand for snacks. Eat one piece of fruit or a handful of berries with breakfast. Have a salad and fruit at lunch. Have two kinds of vegetables at dinner. Limit foods that are high in a substance called oxalate. These include: Spinach (cooked), rhubarb, beets, sweet potatoes, and Swiss chard. Peanuts. Potato chips, french fries, and baked potatoes with skin on. Nuts and nut products. Chocolate. If you regularly take a diuretic medicine, make sure to eat at least 1 or 2 servings of fruits or vegetables that are   high in potassium each day. These include: Avocado. Banana. Orange, prune, carrot, or tomato juice. Baked potato. Cabbage. Beans and split peas. Lifestyle  Drink enough fluid to keep your urine pale yellow. This is the most important thing you can do. Spread your fluid intake throughout the day. If you drink alcohol: Limit how much you use to: 0-1 drink a day for women who  are not pregnant. 0-2 drinks a day for men. Be aware of how much alcohol is in your drink. In the [place] one drink equals one 12 oz bottle of beer (355 mL), one 5 oz glass of wine (148 mL), or one 1 oz glass of hard liquor (44 mL). Lose weight if told by your health care provider. Work with your dietitian to find an eating plan and weight loss strategies that work best for you.  General information Talk to your health care provider and dietitian about taking daily supplements. You may be told the following depending on your health and the cause of your kidney stones: Not to take supplements with vitamin C. To take a calcium supplement. To take a daily probiotic supplement. To take other supplements such as magnesium, fish oil, or vitamin B6. Take over-the-counter and prescription medicines only as told by your health care provider. These include supplements. What foods should I limit? Limit your intake of the following foods, or eat them as told by your dietitian. Vegetables Spinach. Rhubarb. Beets. Canned vegetables. Pickles. Olives. Baked potatoeswith skin. Grains Wheat bran. Baked goods. Salted crackers. Cereals high in sugar. Meats and other proteins Nuts. Nut butters. Large portions of meat, poultry, or fish. Salted, precooked,or cured meats, such as sausages, meat loaves, and hot dogs. Dairy Cheese. Beverages Regular soft drinks. Regular vegetable juice. Seasonings and condiments Seasoning blends with salt. Salad dressings. Soy sauce. Ketchup. Barbecue sauce. Other foods Canned soups. Canned pasta sauce. Casseroles. Pizza. Lasagna. Frozen meals.Potato chips. French fries. The items listed above may not be a complete list of foods and beverages you should limit. Contact a dietitian for more information. What foods should I avoid? Talk to your dietitian about specific foods you should avoid based on the typeof kidney stones you have and your overall health. Fruits Grapefruit. The  item listed above may not be a complete list of foods and beverages you should avoid. Contact a dietitian for more information. Summary Kidney stones are deposits of minerals and salts that form inside your kidneys. You can lower your risk of kidney stones by making changes to your diet. The most important thing you can do is drink enough fluid. Drink enough fluid to keep your urine pale yellow. Talk to your dietitian about how much calcium you should have each day, and eat less salt and animal protein as told by your dietitian. This information is not intended to replace advice given to you by your health care provider. Make sure you discuss any questions you have with your healthcare provider. Document Revised: 04/07/2019 Document Reviewed: 04/07/2019 Elsevier Patient Education  2022 [employer]vier Inc.  

## 2020-12-04 NOTE — Progress Notes (Signed)
12/04/2020 3:03 PM   Stacey Wilson [birth year] [id number]  Referring provider: Babs Sciara, MD [address] [place],  [place] [address]  Patient location: home Physician location: office I connected with  Stacey Wilson on 12/04/20 by a video enabled telemedicine application and verified that I am speaking with the correct person using two identifiers.   I discussed the limitations of evaluation and management by telemedicine. The patient expressed understanding and agreed to proceed.    Nephrolithiasis   HPI: Stacey Wilson is a 40yo here for followup for nephrolithiasis. She denies any flank pain. Last 24 hour urine showed low urine volume. She has bilateral calculi and the left was embedded in the left lower pole. No other complaints today   PMH: Past Medical History:  Diagnosis Date   Anxiety    History of kidney stones    Renal calculi    bilateral   Urgency of urination    Warts, genital     Surgical History: Past Surgical History:  Procedure Laterality Date   BIOPSY  01/10/2020   Procedure: BIOPSY;  Surgeon: Dolores Frame, MD;  [facility]: [facility];  Service: Gastroenterology;;   BREAST SURGERY Right 2002   benign cyst   COLONOSCOPY WITH PROPOFOL N/A 01/10/2020   Procedure: COLONOSCOPY WITH PROPOFOL;  Surgeon: Dolores Frame, MD;  Location: [facility];  Service: Gastroenterology;  Laterality: N/A;  730   CYSTOSCOPY W/ URETERAL STENT PLACEMENT Bilateral 05/19/2016   Procedure: CYSTOSCOPY WITH RETROGRADE PYELOGRAM/URETEROSCOPY/STONE EXTRACTION WITH BASKET / STENT PLACEMENT;  Surgeon: Malen Gauze, MD;  Location[facility];  Service: Urology;  Laterality: Bilateral;   CYSTOSCOPY WITH RETROGRADE PYELOGRAM, URETEROSCOPY AND STENT PLACEMENT Bilateral 07/25/2015   Procedure: CYSTOSCOPY WITH BILATERAL RETROGRADE PYELOGRAM, BILATERAL URETEROSCOPY AND BILATERAL STENT PLACEMENT;  Surgeon: Malen Gauze, MD;   Location: [facility];  Service: Urology;  Laterality: Bilateral;   CYSTOSCOPY WITH RETROGRADE PYELOGRAM, URETEROSCOPY AND STENT PLACEMENT Left 11/07/2020   Procedure: CYSTOSCOPY WITH RETROGRADE PYELOGRAM, URETEROSCOPY AND STENT PLACEMENT;  Surgeon: Malen Gauze, MD;  Location: [facility];  Service: Urology;  Laterality: Left;   STONE EXTRACTION WITH BASKET Bilateral 07/25/2015   Procedure: BILATERAL RENAL STONE EXTRACTION WITH BASKET;  Surgeon: Malen Gauze, MD;  Location: [facility];  Service: Urology;  Laterality: Bilateral;    Home Medications:  Allergies as of 12/04/2020       Reactions   Nystatin Itching        Medication List        Accurate as of December 04, 2020  3:03 PM. If you have any questions, ask your nurse or doctor.          DIGESTIVE ADVANTAGE GUMMIES PO Take 1 capsule by mouth daily.   Incassia 0.35 MG tablet Generic drug: norethindrone TAKE 1 TABLET BY MOUTH EVERY DAY   loratadine 10 MG tablet Commonly known as: CLARITIN Take 10 mg by mouth daily.   ondansetron 4 MG tablet Commonly known as: Zofran Take 1 tablet (4 mg total) by mouth daily as needed for nausea or vomiting.   oxyCODONE-acetaminophen 5-325 MG tablet Commonly known as: Percocet Take 1 tablet by mouth every 4 (four) hours as needed for severe pain.   senna 8.6 MG Tabs tablet Commonly known as: SENOKOT Take 1 tablet by mouth daily.   Vitamin C 500 MG Caps Take 500 mg by mouth daily.        Allergies:  Allergies  Allergen Reactions  Nystatin Itching    Family History: No family history on file.  Social History:  reports that she has been smoking cigarettes. She has a 7.50 pack-year smoking history. She has never used smokeless tobacco. She reports current alcohol use of about 1.0 standard drink of alcohol per week. She reports that she does not use drugs.  ROS: All other review of systems were reviewed and are negative except what is noted above in HPI   Laboratory  Data: Lab Results  Component Value Date   WBC 7.6 11/30/2019   HGB 13.9 11/30/2019   HCT 41.9 11/30/2019   MCV 92 11/30/2019   PLT 248 11/30/2019    Lab Results  Component Value Date   CREATININE 0.69 11/30/2019    No results found for: PSA  No results found for: TESTOSTERONE  No results found for: HGBA1C  Urinalysis    Component Value Date/Time   APPEARANCEUR Clear 10/10/2020 1046   GLUCOSEU Negative 10/10/2020 1046   BILIRUBINUR Negative 10/10/2020 1046   PROTEINUR Negative 10/10/2020 1046   NITRITE Negative 10/10/2020 1046   LEUKOCYTESUR Negative 10/10/2020 1046    Lab Results  Component Value Date   LABMICR See below: 10/10/2020   WBCUA 0-5 10/10/2020   LABEPIT 0-10 10/10/2020   MUCUS Present 10/10/2020   BACTERIA None seen 10/10/2020    Pertinent Imaging:  No results found for this or any previous visit.  No results found for this or any previous visit.  No results found for this or any previous visit.  No results found for this or any previous visit.  Results for orders placed during the hospital encounter of 07/04/16  US Renal  Narrative CLINICAL DATA:  Follow-up kidney stone  EXAM: RENAL / URINARY TRACT ULTRASOUND COMPLETE  COMPARISON:  02/11/2016, CT 07/04/2015  FINDINGS: Right Kidney:  Length: 11.1 cm. Echogenicity within normal limits. No mass or hydronephrosis visualized. Probable shadowing stone in the midpole measuring 5 mm  Left Kidney:  Length: 11.9 cm. Echogenicity within normal limits. Mild fullness of the lower left renal pelvis. No definitive shadowing stones are visualized.  Bladder:  Appears normal for degree of bladder distention. Prevoid bladder volume 136 mL. Postvoid bladder volume 21 mL.  IMPRESSION: 1. Mild enlargement of the left lower renal pelvis without frank hydronephrosis. Previously noted multiple shadowing stones are not clearly visualized. 2. Probable 5 mm shadowing stone in the mid right kidney.  No hydronephrosis.   Electronically Signed By: Jasmine Pang M.D. On: 07/04/2016 17:24  No results found for this or any previous visit.  No results found for this or any previous visit.  Results for orders placed in visit on 10/10/20  CT RENAL STONE STUDY  Narrative CLINICAL DATA:  Flank pain  EXAM: CT ABDOMEN AND PELVIS WITHOUT CONTRAST  TECHNIQUE: Multidetector CT imaging of the abdomen and pelvis was performed following the standard protocol without IV contrast.  COMPARISON:  07/04/2015  FINDINGS: Lower chest: No acute abnormality.  Hepatobiliary: No focal liver abnormality is seen. No gallstones, gallbladder wall thickening, or biliary dilatation.  Pancreas: Unremarkable. No pancreatic ductal dilatation or surrounding inflammatory changes.  Spleen: Normal in size without focal abnormality.  Adrenals/Urinary Tract: Normal adrenal glands.  Bilateral, upper and lower pole renal calculi identified. The largest cluster of stones is in the inferior pole of the right kidney measuring 1 cm, image 69/5. Similar to previous exam. No mass or hydronephrosis identified bilaterally. No hydroureter or ureteral calculi identified. Calcified phleboliths are identified within both sides of  pelvis. The urinary bladder is unremarkable.  Stomach/Bowel: Stomach is within normal limits. Appendix appears normal. No evidence of bowel wall thickening, distention, or inflammatory changes.  Vascular/Lymphatic: No significant vascular findings are present. No enlarged abdominal or pelvic lymph nodes.  Reproductive: Uterus and bilateral adnexa are unremarkable.  Other: No free fluid or fluid collections.  Musculoskeletal: No acute or significant osseous findings.  IMPRESSION: 1. No acute findings within the abdomen or pelvis. The largest cluster of stones is in the inferior pole collecting system of the right kidney measuring 1 cm. Similar to previous exam. 2. Bilateral  nonobstructing renal calculi. 3. Normal appendix.   Electronically Signed By: Signa Kell M.D. On: 10/10/2020 13:11   Assessment & Plan:   Nephrolithiasis -RCT 3 months with KUB and renal US  No follow-ups on file.  Wilkie Aye, MD  [facility] [place]

## 2020-12-05 NOTE — Addendum Note (Signed)
Addended by: Ferdinand Lango on: 12/05/2020 09:22 AM   Modules accepted: Orders

## 2021-01-16 ENCOUNTER — Inpatient Hospital Stay (HOSPITAL_COMMUNITY)
Admission: EM | Admit: 2021-01-16 | Discharge: 2021-01-19 | DRG: 440 | Disposition: A | Discharge status: Home / Self Care | Payer: Managed Care, Other (non HMO) | Attending: Family Medicine | Admitting: Family Medicine

## 2021-01-16 ENCOUNTER — Emergency Department (HOSPITAL_COMMUNITY): Payer: Managed Care, Other (non HMO)

## 2021-01-16 ENCOUNTER — Encounter (HOSPITAL_COMMUNITY): Payer: Self-pay

## 2021-01-16 ENCOUNTER — Other Ambulatory Visit: Payer: Self-pay

## 2021-01-16 DIAGNOSIS — E669 Obesity, unspecified: Secondary | ICD-10-CM | POA: Diagnosis present

## 2021-01-16 DIAGNOSIS — F17210 Nicotine dependence, cigarettes, uncomplicated: Secondary | ICD-10-CM | POA: Diagnosis present

## 2021-01-16 DIAGNOSIS — Z888 Allergy status to other drugs, medicaments and biological substances status: Secondary | ICD-10-CM

## 2021-01-16 DIAGNOSIS — K589 Irritable bowel syndrome without diarrhea: Secondary | ICD-10-CM | POA: Diagnosis present

## 2021-01-16 DIAGNOSIS — K859 Acute pancreatitis without necrosis or infection, unspecified: Secondary | ICD-10-CM | POA: Diagnosis present

## 2021-01-16 DIAGNOSIS — Z20822 Contact with and (suspected) exposure to covid-19: Secondary | ICD-10-CM | POA: Diagnosis present

## 2021-01-16 DIAGNOSIS — K760 Fatty (change of) liver, not elsewhere classified: Secondary | ICD-10-CM | POA: Diagnosis present

## 2021-01-16 DIAGNOSIS — Z79899 Other long term (current) drug therapy: Secondary | ICD-10-CM | POA: Diagnosis not present

## 2021-01-16 DIAGNOSIS — Z720 Tobacco use: Secondary | ICD-10-CM | POA: Diagnosis not present

## 2021-01-16 DIAGNOSIS — Z6834 Body mass index (BMI) 34.0-34.9, adult: Secondary | ICD-10-CM | POA: Diagnosis not present

## 2021-01-16 LAB — URINALYSIS, ROUTINE W REFLEX MICROSCOPIC
Bilirubin Urine: NEGATIVE
Glucose, UA: NEGATIVE mg/dL
Ketones, ur: NEGATIVE mg/dL
Leukocytes,Ua: NEGATIVE
Nitrite: NEGATIVE
Specific Gravity, Urine: >1.03 — ABNORMAL HIGH (ref 1.005–1.030)
pH: 5.5 (ref 5.0–8.0)

## 2021-01-16 LAB — COMPREHENSIVE METABOLIC PANEL
ALT: 24 U/L (ref 0–44)
AST: 23 U/L (ref 15–41)
Albumin: 4.5 g/dL (ref 3.5–5.0)
Alkaline Phosphatase: 47 U/L (ref 38–126)
Anion gap: 11 (ref 5–15)
BUN: 19 mg/dL (ref 6–20)
CO2: 18 mmol/L — ABNORMAL LOW (ref 22–32)
Calcium: 9.5 mg/dL (ref 8.9–10.3)
Chloride: 104 mmol/L (ref 98–111)
Creatinine, Ser: 0.65 mg/dL (ref 0.44–1.00)
GFR, Estimated: >60 mL/min (ref >60)
Glucose, Bld: 118 mg/dL — ABNORMAL HIGH (ref 70–99)
Potassium: 3.4 mmol/L — ABNORMAL LOW (ref 3.5–5.1)
Sodium: 133 mmol/L — ABNORMAL LOW (ref 135–145)
Total Bilirubin: 0.8 mg/dL (ref 0.3–1.2)
Total Protein: 7.8 g/dL (ref 6.5–8.1)

## 2021-01-16 LAB — CBC
HCT: 45.9 % (ref 36.0–46.0)
Hemoglobin: 15.5 g/dL — ABNORMAL HIGH (ref 12.0–15.0)
MCH: 30.8 pg (ref 26.0–34.0)
MCHC: 33.8 g/dL (ref 30.0–36.0)
MCV: 91.1 fL (ref 80.0–100.0)
Platelets: 323 10*3/uL (ref 150–400)
RBC: 5.04 MIL/uL (ref 3.87–5.11)
RDW: 12.4 % (ref 11.5–15.5)
WBC: 14.8 10*3/uL — ABNORMAL HIGH (ref 4.0–10.5)
nRBC: 0 % (ref 0.0–0.2)

## 2021-01-16 LAB — URINALYSIS, MICROSCOPIC (REFLEX)

## 2021-01-16 LAB — MAGNESIUM: Magnesium: 1.9 mg/dL (ref 1.7–2.4)

## 2021-01-16 LAB — POC URINE PREG, ED: Preg Test, Ur: NEGATIVE

## 2021-01-16 LAB — LIPASE, BLOOD: Lipase: 291 U/L — ABNORMAL HIGH (ref 11–51)

## 2021-01-16 MED ORDER — MORPHINE SULFATE (PF) 4 MG/ML IV SOLN
4.0000 mg | Freq: Once | INTRAVENOUS | Status: AC
  Administered 2021-01-16: 4 mg via INTRAVENOUS
  Filled 2021-01-16: qty 1

## 2021-01-16 MED ORDER — POLYETHYLENE GLYCOL 3350 17 G PO PACK: 17.0000 g | PACK | Freq: Every day | ORAL | Status: DC | PRN

## 2021-01-16 MED ORDER — HYDROMORPHONE HCL 1 MG/ML IJ SOLN
1.0000 mg | Freq: Once | INTRAMUSCULAR | Status: AC
  Administered 2021-01-16: 1 mg via INTRAVENOUS
  Filled 2021-01-16: qty 1

## 2021-01-16 MED ORDER — HYDROMORPHONE HCL 1 MG/ML IJ SOLN
1.0000 mg | INTRAMUSCULAR | Status: DC | PRN
Start: 2021-01-16 — End: 2021-01-17
  Administered 2021-01-16 – 2021-01-17 (×4): 1 mg via INTRAVENOUS
  Filled 2021-01-16 (×5): qty 1

## 2021-01-16 MED ORDER — KETOROLAC TROMETHAMINE 30 MG/ML IJ SOLN
30.0000 mg | Freq: Once | INTRAMUSCULAR | Status: AC
  Administered 2021-01-16: 30 mg via INTRAVENOUS
  Filled 2021-01-16: qty 1

## 2021-01-16 MED ORDER — ONDANSETRON HCL 4 MG PO TABS
4.0000 mg | ORAL_TABLET | Freq: Four times a day (QID) | ORAL | Status: DC | PRN
  Filled 2021-01-16: qty 1

## 2021-01-16 MED ORDER — SODIUM CHLORIDE 0.9 % IV BOLUS
1000.0000 mL | Freq: Once | INTRAVENOUS | Status: AC
  Administered 2021-01-16: 1000 mL via INTRAVENOUS

## 2021-01-16 MED ORDER — ENOXAPARIN SODIUM 40 MG/0.4ML IJ SOSY
40.0000 mg | PREFILLED_SYRINGE | INTRAMUSCULAR | Status: DC
  Administered 2021-01-17 – 2021-01-19 (×3): 40 mg via SUBCUTANEOUS
  Filled 2021-01-16 (×3): qty 0.4

## 2021-01-16 MED ORDER — IOHEXOL 350 MG/ML SOLN
100.0000 mL | Freq: Once | INTRAVENOUS | Status: AC | PRN
  Administered 2021-01-16: 80 mL via INTRAVENOUS

## 2021-01-16 MED ORDER — ONDANSETRON HCL 4 MG/2ML IJ SOLN
4.0000 mg | Freq: Once | INTRAMUSCULAR | Status: AC
  Administered 2021-01-16: 4 mg via INTRAVENOUS
  Filled 2021-01-16: qty 2

## 2021-01-16 MED ORDER — ACETAMINOPHEN 650 MG RE SUPP: 650.0000 mg | Freq: Four times a day (QID) | RECTAL | Status: DC | PRN

## 2021-01-16 MED ORDER — KCL IN DEXTROSE-NACL 20-5-0.9 MEQ/L-%-% IV SOLN
INTRAVENOUS | Status: DC
  Filled 2021-01-16 (×3): qty 1000

## 2021-01-16 MED ORDER — ONDANSETRON HCL 4 MG/2ML IJ SOLN: 4.0000 mg | Freq: Four times a day (QID) | INTRAMUSCULAR | Status: DC | PRN

## 2021-01-16 MED ORDER — NORETHINDRONE 0.35 MG PO TABS
1.0000 | ORAL_TABLET | Freq: Every day | ORAL | Status: DC
  Administered 2021-01-18: 0.35 mg via ORAL

## 2021-01-16 MED ORDER — ACETAMINOPHEN 325 MG PO TABS: 650.0000 mg | ORAL_TABLET | Freq: Four times a day (QID) | ORAL | Status: DC | PRN

## 2021-01-16 NOTE — ED Triage Notes (Signed)
Pt presents to ED via [facility] for mid abdominal pain radiating to back, vomiting and nausea.

## 2021-01-16 NOTE — ED Triage Notes (Signed)
Pt awoke 1am with sharp abdominal pain, nausea,vomiting.  Took ibuprofen and phenergan prior to ems arrival.  No relief of symptoms.  To lobby to await triage

## 2021-01-16 NOTE — ED Notes (Signed)
Called ac for IVF

## 2021-01-16 NOTE — ED Provider Notes (Signed)
[facility] EMERGENCY DEPARTMENT Provider Note   CSN: [id number] Arrival date & time: 01/16/21  6222     History Chief Complaint  Patient presents with   Abdominal Pain    N/V    Stacey Wilson is a 40 y.o. female with a history of kidney stones, also IBS with associated episodes of abdominal pain presenting with sharp epigastric and left upper quadrant which woke her from sleep around 1 AM today.  She also endorses nausea with emesis.  Pain is uncontrolled, not better with rest or movement but has been unable to sit still secondary to pain.  She reports the pain radiates into her back.  Denies fevers.  Also no shortness of breath, chest pain. She denies any diarrhea or change in bowel habits, last bowel movement was yesterday x2.  She has had no hematemesis, denies history of acid reflux or GERD, denies back or flank pain and states her pain is not similar to prior episodes of kidney stone passage.  She took ibuprofen and Phenergan prior to arrival with no relief of her symptoms.  Of note, and initial evaluation some of her labs have resulted, specifically she does have a significant elevation in her lipase at 291.  Her LFTs are normal.  She denies significant EtOH use.  She does drink occasionally,socially last intake was 12 days ago, drank about 4 beers.  She denies a history of pancreatitis.  The history is provided by the patient and the spouse.      Past Medical History:  Diagnosis Date   Anxiety    History of kidney stones    Renal calculi    bilateral   Urgency of urination    Warts, genital     Patient Active Problem List   Diagnosis Date Noted   Kidney stones 12/04/2020   Bloating 12/26/2019   Irritable bowel syndrome 12/26/2019   Screening cholesterol level 12/13/2019   Elevated TSH 12/13/2019   Abnormal bowel movement 12/02/2019   Abdominal pain 12/02/2019   Encounter for gynecological examination with Papanicolaou smear of cervix 10/13/2019   Breast asymmetry  in female 10/13/2019   Large breasts 10/13/2019   Neck and shoulder pain 10/13/2019   Pap smear, as part of routine gynecological examination 08/07/2016   Warts, genital 07/03/2015   Itching in the vaginal area 07/03/2015   Anxiety 12/23/2013   Vaginal discharge 12/16/2013   Contraceptive management 11/29/2013    Past Surgical History:  Procedure Laterality Date   BIOPSY  01/10/2020   Procedure: BIOPSY;  Surgeon: Dolores Frame, MD;  [facility]: [facility];  Service: Gastroenterology;;   BREAST SURGERY Right 2002   benign cyst   COLONOSCOPY WITH PROPOFOL N/A 01/10/2020   Procedure: COLONOSCOPY WITH PROPOFOL;  Surgeon: Dolores Frame, MD;  Location: [facility];  Service: Gastroenterology;  Laterality: N/A;  730   CYSTOSCOPY W/ URETERAL STENT PLACEMENT Bilateral 05/19/2016   Procedure: CYSTOSCOPY WITH RETROGRADE PYELOGRAM/URETEROSCOPY/STONE EXTRACTION WITH BASKET / STENT PLACEMENT;  Surgeon: Malen Gauze, MD;  Location: [facility];  Service: Urology;  Laterality: Bilateral;   CYSTOSCOPY WITH RETROGRADE PYELOGRAM, URETEROSCOPY AND STENT PLACEMENT Bilateral 07/25/2015   Procedure: CYSTOSCOPY WITH BILATERAL RETROGRADE PYELOGRAM, BILATERAL URETEROSCOPY AND BILATERAL STENT PLACEMENT;  Surgeon: Malen Gauze, MD;  Location: [facility];  Service: Urology;  Laterality: Bilateral;   CYSTOSCOPY WITH RETROGRADE PYELOGRAM, URETEROSCOPY AND STENT PLACEMENT Left 11/07/2020   Procedure: CYSTOSCOPY WITH RETROGRADE PYELOGRAM, URETEROSCOPY AND STENT PLACEMENT;  Surgeon: Malen Gauze,  MD;  Location: [facility];  Service: Urology;  Laterality: Left;   STONE EXTRACTION WITH BASKET Bilateral 07/25/2015   Procedure: BILATERAL RENAL STONE EXTRACTION WITH BASKET;  Surgeon: Malen Gauze, MD;  Location: [facility];  Service: Urology;  Laterality: Bilateral;     OB History     Gravida  1   Para  1   Term      Preterm      AB      Living  1      SAB       IAB      Ectopic      Multiple      Live Births              No family history on file.  Social History   Tobacco Use   Smoking status: Every Day    Packs/day: 0.50    Years: 15.00    Pack years: 7.50    Types: Cigarettes   Smokeless tobacco: Never  Vaping Use   Vaping Use: Never used  Substance Use Topics   Alcohol use: Yes    Alcohol/week: 1.0 standard drink    Types: 1 Glasses of wine per week    Comment: socially    Drug use: No    Home Medications Prior to Admission medications   Medication Sig Start Date End Date Taking? Authorizing Provider  Ascorbic Acid (VITAMIN C) 500 MG CAPS Take 500 mg by mouth daily.   Yes [provider]  INCASSIA 0.35 MG tablet TAKE 1 TABLET BY MOUTH EVERY DAY Patient taking differently: Take 1 tablet by mouth daily. 10/30/20  Yes Cyril Mourning A, NP  loratadine (CLARITIN) 10 MG tablet Take 10 mg by mouth daily.   Yes [provider]  promethazine (PHENERGAN) 12.5 MG tablet Take 12.5 mg by mouth every 6 (six) hours as needed for nausea or vomiting.   Yes [provider]  ondansetron (ZOFRAN) 4 MG tablet Take 1 tablet (4 mg total) by mouth daily as needed for nausea or vomiting. Patient not taking: No sig reported 11/07/20 11/07/21  Malen Gauze, MD  oxyCODONE-acetaminophen (PERCOCET) 5-325 MG tablet Take 1 tablet by mouth every 4 (four) hours as needed for severe pain. 11/07/20 11/07/21  Malen Gauze, MD    Allergies    Nystatin  Review of Systems   Review of Systems  Constitutional:  Negative for chills and fever.  HENT: Negative.  Negative for congestion.   Eyes: Negative.   Respiratory:  Negative for chest tightness and shortness of breath.   Cardiovascular:  Negative for chest pain.  Gastrointestinal:  Positive for abdominal pain, nausea and vomiting.  Genitourinary: Negative.  Negative for dysuria, flank pain and hematuria.  Musculoskeletal:  Negative for arthralgias, joint  swelling and neck pain.  Skin: Negative.  Negative for rash and wound.  Neurological:  Negative for dizziness, weakness, light-headedness, numbness and headaches.  Psychiatric/Behavioral: Negative.    All other systems reviewed and are negative.  Physical Exam Updated Vital Signs BP (!) 144/98 (BP Location: Left Arm)   Pulse 90   Temp 97.9 F (36.6 C)   Resp (!) 22   Ht 5\' 5"  (1.651 m)   Wt 95.5 kg   LMP 01/10/2021   SpO2 97%   BMI 35.03 kg/m   Physical Exam Vitals and nursing note reviewed.  Constitutional:      Appearance: She is well-developed.  HENT:     Head: Normocephalic and atraumatic.  Eyes:     Conjunctiva/sclera: Conjunctivae normal.  Cardiovascular:     Rate and Rhythm: Normal rate and regular rhythm.     Heart sounds: Normal heart sounds.  Pulmonary:     Effort: Pulmonary effort is normal.     Breath sounds: Normal breath sounds. No wheezing.  Abdominal:     General: Bowel sounds are normal.     Palpations: Abdomen is soft.     Tenderness: There is abdominal tenderness in the epigastric area and left upper quadrant. There is guarding.  Musculoskeletal:        General: Normal range of motion.     Cervical back: Normal range of motion.  Skin:    General: Skin is warm and dry.  Neurological:     Mental Status: She is alert.    ED Results / Procedures / Treatments   Labs (all labs ordered are listed, but only abnormal results are displayed) Labs Reviewed  LIPASE, BLOOD - Abnormal; Notable for the following components:      Result Value   Lipase 291 (*)    All other components within normal limits  COMPREHENSIVE METABOLIC PANEL - Abnormal; Notable for the following components:   Sodium 133 (*)    Potassium 3.4 (*)    CO2 18 (*)    Glucose, Bld 118 (*)    All other components within normal limits  CBC - Abnormal; Notable for the following components:   WBC 14.8 (*)    Hemoglobin 15.5 (*)    All other components within normal limits  URINALYSIS,  ROUTINE W REFLEX MICROSCOPIC - Abnormal; Notable for the following components:   APPearance HAZY (*)    Specific Gravity, Urine >1.030 (*)    Hgb urine dipstick MODERATE (*)    Protein, ur TRACE (*)    All other components within normal limits  URINALYSIS, MICROSCOPIC (REFLEX) - Abnormal; Notable for the following components:   Bacteria, UA MANY (*)    All other components within normal limits  POC URINE PREG, ED    EKG None  Radiology CT ABDOMEN PELVIS W CONTRAST  Result Date: 01/16/2021 CLINICAL DATA:  Left upper quadrant pain no gross left upper quadrant and lower abdominal pain EXAM: CT ABDOMEN AND PELVIS WITH CONTRAST TECHNIQUE: Multidetector CT imaging of the abdomen and pelvis was performed using the standard protocol following bolus administration of intravenous contrast. CONTRAST:  23mL OMNIPAQUE IOHEXOL 350 MG/ML SOLN COMPARISON:  10/10/2020 FINDINGS: Lower chest: Increased bibasilar atelectasis. Normal heart size. No pericardial or pleural effusion. Hepatobiliary: No focal liver abnormality is seen. No gallstones, gallbladder wall thickening, or biliary dilatation. Pancreas: Diffuse mild peripancreatic strandy edema/fluid extending into the right abdominal mesentery and along the duodenum compatible with acute pancreatitis. Pancreas continues to enhance normally. No fluid collection or ductal dilatation. No vascular complication. Spleen: Normal in size without focal abnormality. Adrenals/Urinary Tract: Normal adrenal glands. Similar bilateral nonobstructing nephrolithiasis most pronounced in the lower poles. No hydronephrosis or hydroureter. Ureters are symmetric and decompressed. Bladder collapsed. Stomach/Bowel: Stomach is within normal limits. Appendix appears normal. No evidence of bowel wall thickening, distention, or inflammatory changes. Vascular/Lymphatic: Intact aorta. Negative for aneurysm or dissection. Mesenteric and renal vasculature all appear patent. No veno-occlusive  process. No bulky adenopathy. Reproductive: Uterus and adnexa normal in size. Small amount of pelvic free fluid. Other: No abdominal wall hernia or abnormality. No abdominopelvic ascites. Musculoskeletal: No acute or significant osseous findings. IMPRESSION: Mild acute pancreatitis without other complicating feature. Bibasilar atelectasis Nonobstructing bilateral nephrolithiasis  Electronically Signed   By: Judie Petit.  Shick M.D.   On: 01/16/2021 15:42    Procedures Procedures   Medications Ordered in ED Medications  HYDROmorphone (DILAUDID) injection 1 mg (has no administration in time range)  sodium chloride 0.9 % bolus 1,000 mL (has no administration in time range)  morphine 4 MG/ML injection 4 mg (4 mg Intravenous Given 01/16/21 1132)  ondansetron (ZOFRAN) injection 4 mg (4 mg Intravenous Given 01/16/21 1132)  HYDROmorphone (DILAUDID) injection 1 mg (1 mg Intravenous Given 01/16/21 1248)  iohexol (OMNIPAQUE) 350 MG/ML injection 100 mL (80 mLs Intravenous Contrast Given 01/16/21 1507)  HYDROmorphone (DILAUDID) injection 1 mg (1 mg Intravenous Given 01/16/21 1543)    ED Course  I have reviewed the triage vital signs and the nursing notes.  Pertinent labs & imaging results that were available during my care of the patient were reviewed by me and considered in my medical decision making (see chart for details).    MDM Rules/Calculators/A&P                           Pt with acute pancreatitis, CT imaging is negative for other findings including gallstones or acute cholecystitis.  Reason for her pancreatitis is unclear at this time.  She was given multiple doses of pain medication here without resolution of pain.  She will need admission for pain control and further evaluation for source of her pancreatitis.   Call to Dr Mariea Clonts with hospitalist service. Will admit patient. Final Clinical Impression(s) / ED Diagnoses Final diagnoses:  Acute pancreatitis, unspecified complication status, unspecified  pancreatitis type    Rx / DC Orders ED Discharge Orders     None        Victoriano Lain 01/16/21 1759    Mancel Bale, MD 01/17/21 [id number]

## 2021-01-16 NOTE — H&P (Addendum)
History and Physical    Stacey Wilson QIH:[id number] DOB: April 27, 1981 DOA: 01/16/2021  PCP: Babs Sciara, MD   Patient coming from: Home  I have personally briefly reviewed patient's old medical records in [facility]  Chief Complaint: Abdominal pain  HPI: Stacey Wilson is a 40 y.o. female with medical history significant for kidney stones, IBS , she presented with complaints of severe abdominal pain that started at about 1:30 in the morning.  Pain is in her upper abdomen, and radiates around her ribs to her back.  With persistence of pain despite several conservative measures she took presented to ED. She reports similar episode of vomiting.  No diarrhea.  No pain with urination. No prior hx of pancreatitis of similar presentation.  Drinks alcoholic beverages very occasionally.  ED Course: Temperature 97.9.  Heart rate 72-90.  Respiratory 18-24.  Blood pressure systolic 130s to 875I.  Lipase 291, WBC 14.8.  UA shows many bacteria.  Sodium 133.  Potassium 3.4.  Abdominal CT shows mild acute pancreatitis, no gallstones, no dilatation of the biliary tract. Female smoker, regarding multiple, patient required IV Dilaudid for pain relief.  Hospitalist to admit.  Review of Systems: As per HPI all other systems reviewed and negative.  Past Medical History:  Diagnosis Date   Anxiety    History of kidney stones    Renal calculi    bilateral   Urgency of urination    Warts, genital     Past Surgical History:  Procedure Laterality Date   BIOPSY  01/10/2020   Procedure: BIOPSY;  Surgeon: Dolores Frame, MD;  [facility];  Service: Gastroenterology;;   BREAST SURGERY Right 2002   benign cyst   COLONOSCOPY WITH PROPOFOL N/A 01/10/2020   Procedure: COLONOSCOPY WITH PROPOFOL;  Surgeon: Dolores Frame, MD;  [facility];  Service: Gastroenterology;  Laterality: N/A;  730   CYSTOSCOPY W/ URETERAL STENT PLACEMENT Bilateral 05/19/2016   Procedure:  CYSTOSCOPY WITH RETROGRADE PYELOGRAM/URETEROSCOPY/STONE EXTRACTION WITH BASKET / STENT PLACEMENT;  Surgeon: Malen Gauze, MD;  Location: [facility];  Service: Urology;  Laterality: Bilateral;   CYSTOSCOPY WITH RETROGRADE PYELOGRAM, URETEROSCOPY AND STENT PLACEMENT Bilateral 07/25/2015   Procedure: CYSTOSCOPY WITH BILATERAL RETROGRADE PYELOGRAM, BILATERAL URETEROSCOPY AND BILATERAL STENT PLACEMENT;  Surgeon: Malen Gauze, MD;  Location: [facility];  Service: Urology;  Laterality: Bilateral;   CYSTOSCOPY WITH RETROGRADE PYELOGRAM, URETEROSCOPY AND STENT PLACEMENT Left 11/07/2020   Procedure: CYSTOSCOPY WITH RETROGRADE PYELOGRAM, URETEROSCOPY AND STENT PLACEMENT;  Surgeon: Malen Gauze, MD;  Location: [facility];  Service: Urology;  Laterality: Left;   STONE EXTRACTION WITH BASKET Bilateral 07/25/2015   Procedure: BILATERAL RENAL STONE EXTRACTION WITH BASKET;  Surgeon: Malen Gauze, MD;  Location: [facility];  Service: Urology;  Laterality: Bilateral;     reports that she has been smoking cigarettes. She has a 7.50 pack-year smoking history. She has never used smokeless tobacco. She reports current alcohol use of about 1.0 standard drink per week. She reports that she does not use drugs.  Allergies  Allergen Reactions   Nystatin Itching   No history of diabetes or hypertension in either parents.  Prior to Admission medications   Medication Sig Start Date End Date Taking? Authorizing Provider  Ascorbic Acid (VITAMIN C) 500 MG CAPS Take 500 mg by mouth daily.   Yes [provider]  INCASSIA 0.35 MG tablet TAKE 1 TABLET BY MOUTH EVERY DAY Patient taking differently: Take 1 tablet  by mouth daily. 10/30/20  Yes Cyril Mourning A, NP  loratadine (CLARITIN) 10 MG tablet Take 10 mg by mouth daily.   Yes [provider]  promethazine (PHENERGAN) 12.5 MG tablet Take 12.5 mg by mouth every 6 (six) hours as needed for nausea or vomiting.   Yes [provider]  ondansetron (ZOFRAN) 4 MG tablet Take 1 tablet (4 mg total) by mouth daily as needed for nausea or vomiting. Patient not taking: No sig reported 11/07/20 11/07/21  Malen Gauze, MD  oxyCODONE-acetaminophen (PERCOCET) 5-325 MG tablet Take 1 tablet by mouth every 4 (four) hours as needed for severe pain. 11/07/20 11/07/21  Malen Gauze, MD    Physical Exam: Vitals:   01/16/21 1016 01/16/21 1018 01/16/21 1200 01/16/21 1534  BP: (!) 153/105  (!) 139/94 (!) 144/98  Pulse: 76  72 90  Resp: 18  (!) 24 (!) 22  Temp: 97.9 F (36.6 C)     SpO2: 98%  95% 97%  Weight:  95.5 kg    Height:        Constitutional: NAD, calm, comfortable Vitals:   01/16/21 1016 01/16/21 1018 01/16/21 1200 01/16/21 1534  BP: (!) 153/105  (!) 139/94 (!) 144/98  Pulse: 76  72 90  Resp: 18  (!) 24 (!) 22  Temp: 97.9 F (36.6 C)     SpO2: 98%  95% 97%  Weight:  95.5 kg    Height:       Eyes:  lids and conjunctivae normal ENMT: Mucous membranes are moist.  Neck: normal, supple, no masses, no thyromegaly Respiratory: clear to auscultation bilaterally, no wheezing, no crackles. Normal respiratory effort. No accessory muscle use.  Cardiovascular: Regular rate and rhythm, no murmurs / rubs / gallops. No extremity edema. 2+ pedal pulses. No carotid bruits.  Abdomen: mild epigastric tenderness to light touch s/p dilaudid. Bowel sounds positive.  Musculoskeletal: no clubbing / cyanosis. No joint deformity upper and lower extremities. Good ROM, no contractures. Normal muscle tone.  Skin: no rashes, lesions, ulcers. No induration Neurologic: No apparent cranial nerve abnormality, moving extremities spontaneously. Psychiatric: Normal judgment and insight. Alert and oriented x 3. Normal mood.   Labs on Admission: I have personally reviewed following labs and imaging studies  CBC: Recent Labs  Lab 01/16/21 1019  WBC 14.8*  HGB 15.5*  HCT 45.9  MCV 91.1  PLT 323   Basic Metabolic Panel: Recent Labs   Lab 01/16/21 1019  NA 133*  K 3.4*  CL 104  CO2 18*  GLUCOSE 118*  BUN 19  CREATININE 0.65  CALCIUM 9.5   Liver Function Tests: Recent Labs  Lab 01/16/21 1019  AST 23  ALT 24  ALKPHOS 47  BILITOT 0.8  PROT 7.8  ALBUMIN 4.5   Recent Labs  Lab 01/16/21 1019  LIPASE 291*   Urine analysis:    Component Value Date/Time   COLORURINE YELLOW 01/16/2021 1445   APPEARANCEUR HAZY (A) 01/16/2021 1445   APPEARANCEUR Clear 10/10/2020 1046   LABSPEC >1.030 (H) 01/16/2021 1445   PHURINE 5.5 01/16/2021 1445   GLUCOSEU NEGATIVE 01/16/2021 1445   HGBUR MODERATE (A) 01/16/2021 1445   BILIRUBINUR NEGATIVE 01/16/2021 1445   BILIRUBINUR Negative 10/10/2020 1046   KETONESUR NEGATIVE 01/16/2021 1445   PROTEINUR TRACE (A) 01/16/2021 1445   NITRITE NEGATIVE 01/16/2021 1445   LEUKOCYTESUR NEGATIVE 01/16/2021 1445    Radiological Exams on Admission: CT ABDOMEN PELVIS W CONTRAST  Result Date: 01/16/2021 CLINICAL DATA:  Left upper quadrant  pain no gross left upper quadrant and lower abdominal pain EXAM: CT ABDOMEN AND PELVIS WITH CONTRAST TECHNIQUE: Multidetector CT imaging of the abdomen and pelvis was performed using the standard protocol following bolus administration of intravenous contrast. CONTRAST:  32mL OMNIPAQUE IOHEXOL 350 MG/ML SOLN COMPARISON:  10/10/2020 FINDINGS: Lower chest: Increased bibasilar atelectasis. Normal heart size. No pericardial or pleural effusion. Hepatobiliary: No focal liver abnormality is seen. No gallstones, gallbladder wall thickening, or biliary dilatation. Pancreas: Diffuse mild peripancreatic strandy edema/fluid extending into the right abdominal mesentery and along the duodenum compatible with acute pancreatitis. Pancreas continues to enhance normally. No fluid collection or ductal dilatation. No vascular complication. Spleen: Normal in size without focal abnormality. Adrenals/Urinary Tract: Normal adrenal glands. Similar bilateral nonobstructing  nephrolithiasis most pronounced in the lower poles. No hydronephrosis or hydroureter. Ureters are symmetric and decompressed. Bladder collapsed. Stomach/Bowel: Stomach is within normal limits. Appendix appears normal. No evidence of bowel wall thickening, distention, or inflammatory changes. Vascular/Lymphatic: Intact aorta. Negative for aneurysm or dissection. Mesenteric and renal vasculature all appear patent. No veno-occlusive process. No bulky adenopathy. Reproductive: Uterus and adnexa normal in size. Small amount of pelvic free fluid. Other: No abdominal wall hernia or abnormality. No abdominopelvic ascites. Musculoskeletal: No acute or significant osseous findings. IMPRESSION: Mild acute pancreatitis without other complicating feature. Bibasilar atelectasis Nonobstructing bilateral nephrolithiasis Electronically Signed   By: Judie Petit.  Shick M.D.   On: 01/16/2021 15:42    EKG: None   Assessment/Plan Active Problems:   Acute pancreatitis   Acute pancreatitis-epigastric abdominal pain, vomiting.  Leukocytosis 14.3. Lipase of 2021, CT showing mild acute pancreatitis, no gallstones, no ductal dilation.  ? Etiology.  No culprit medications identified. Reports occasional alcohol intake. -Bowel rest, NPO -IV Dilaudid 1 mg every 3 hours as needed for pain - 2 L bolus continue D5 N/s + 20 kcl 125cc/hr x 20hrs - IV famotidine 20 mg daily -Lipid panel in the morning -UA with many bacteria, negative nitrites and leukocytes, she has no urinary symptoms at this time.  Antibiotics deferred. -Continuous pulse ox  DVT prophylaxis: Lovenox Code Status: Full code Family Communication:  None at bedside symptoms Disposition Plan: ~ 2 days Consults called: None Admission status: Inpt, med surg  Onnie Boer MD [facility]  01/16/2021, 7:37 PM

## 2021-01-16 NOTE — ED Notes (Signed)
Pt asking for toradol, hospitalist messaged about the same.

## 2021-01-17 ENCOUNTER — Inpatient Hospital Stay (HOSPITAL_COMMUNITY): Payer: Managed Care, Other (non HMO)

## 2021-01-17 DIAGNOSIS — K859 Acute pancreatitis without necrosis or infection, unspecified: Secondary | ICD-10-CM | POA: Diagnosis not present

## 2021-01-17 DIAGNOSIS — E669 Obesity, unspecified: Secondary | ICD-10-CM | POA: Diagnosis present

## 2021-01-17 LAB — CBC
HCT: 37 % (ref 36.0–46.0)
Hemoglobin: 12.4 g/dL (ref 12.0–15.0)
MCH: 31.3 pg (ref 26.0–34.0)
MCHC: 33.5 g/dL (ref 30.0–36.0)
MCV: 93.4 fL (ref 80.0–100.0)
Platelets: 216 10*3/uL (ref 150–400)
RBC: 3.96 MIL/uL (ref 3.87–5.11)
RDW: 12.7 % (ref 11.5–15.5)
WBC: 10.2 10*3/uL (ref 4.0–10.5)
nRBC: 0 % (ref 0.0–0.2)

## 2021-01-17 LAB — LIPID PANEL
Cholesterol: 162 mg/dL (ref 0–200)
HDL: 47 mg/dL (ref >40)
LDL Cholesterol: 97 mg/dL (ref 0–99)
Total CHOL/HDL Ratio: 3.4 RATIO
Triglycerides: 90 mg/dL (ref <150)
VLDL: 18 mg/dL (ref 0–40)

## 2021-01-17 LAB — COMPREHENSIVE METABOLIC PANEL
ALT: 17 U/L (ref 0–44)
AST: 15 U/L (ref 15–41)
Albumin: 3.5 g/dL (ref 3.5–5.0)
Alkaline Phosphatase: 37 U/L — ABNORMAL LOW (ref 38–126)
Anion gap: 6 (ref 5–15)
BUN: 20 mg/dL (ref 6–20)
CO2: 24 mmol/L (ref 22–32)
Calcium: 8.3 mg/dL — ABNORMAL LOW (ref 8.9–10.3)
Chloride: 106 mmol/L (ref 98–111)
Creatinine, Ser: 0.49 mg/dL (ref 0.44–1.00)
GFR, Estimated: >60 mL/min (ref >60)
Glucose, Bld: 107 mg/dL — ABNORMAL HIGH (ref 70–99)
Potassium: 3.7 mmol/L (ref 3.5–5.1)
Sodium: 136 mmol/L (ref 135–145)
Total Bilirubin: 0.4 mg/dL (ref 0.3–1.2)
Total Protein: 6.3 g/dL — ABNORMAL LOW (ref 6.5–8.1)

## 2021-01-17 LAB — GLUCOSE, CAPILLARY: Glucose-Capillary: 108 mg/dL — ABNORMAL HIGH (ref 70–99)

## 2021-01-17 LAB — HIV ANTIBODY (ROUTINE TESTING W REFLEX): HIV Screen 4th Generation wRfx: NONREACTIVE

## 2021-01-17 LAB — SARS CORONAVIRUS 2 (TAT 6-24 HRS): SARS Coronavirus 2: NEGATIVE

## 2021-01-17 MED ORDER — HYDROMORPHONE HCL 1 MG/ML IJ SOLN
1.0000 mg | INTRAMUSCULAR | Status: DC | PRN
  Administered 2021-01-17 (×3): 1 mg via INTRAVENOUS
  Filled 2021-01-17 (×2): qty 1

## 2021-01-17 MED ORDER — KETOROLAC TROMETHAMINE 30 MG/ML IJ SOLN
30.0000 mg | Freq: Three times a day (TID) | INTRAMUSCULAR | Status: AC
  Administered 2021-01-17 – 2021-01-18 (×4): 30 mg via INTRAVENOUS
  Filled 2021-01-17 (×4): qty 1

## 2021-01-17 MED ORDER — DEXTROSE-NACL 5-0.45 % IV SOLN: INTRAVENOUS | Status: DC

## 2021-01-17 MED ORDER — SODIUM CHLORIDE 0.9 % IV SOLN: INTRAVENOUS | Status: AC

## 2021-01-17 MED ORDER — HYDROMORPHONE HCL 1 MG/ML IJ SOLN
1.0000 mg | INTRAMUSCULAR | Status: DC | PRN
  Administered 2021-01-18: 1 mg via INTRAVENOUS
  Filled 2021-01-17 (×2): qty 1

## 2021-01-17 MED ORDER — KETOROLAC TROMETHAMINE 30 MG/ML IJ SOLN: 30.0000 mg | Freq: Three times a day (TID) | INTRAMUSCULAR | Status: DC | PRN

## 2021-01-17 MED ORDER — PANTOPRAZOLE SODIUM 40 MG IV SOLR
40.0000 mg | INTRAVENOUS | Status: DC
  Administered 2021-01-17 – 2021-01-19 (×3): 40 mg via INTRAVENOUS
  Filled 2021-01-17 (×3): qty 40

## 2021-01-17 NOTE — ED Notes (Signed)
Pt is alert oriented ambulatory.

## 2021-01-17 NOTE — Progress Notes (Signed)
Patient Demographics:    Stacey Wilson, is a 40 y.o. female, DOB - [birth year], GEX:[id number]  Admit date - 01/16/2021   Admitting Physician Ejiroghene Wendall Stade, MD  Outpatient Primary MD for the patient is Babs Sciara, MD  LOS - 1   Chief Complaint  Patient presents with   Abdominal Pain    N/V        Subjective:    Stacey Wilson today has no fevers,    No chest pain, -Abdominal pain persist -Remains n.p.o., having nausea no emesis but again no oral intake so far   Assessment  & Plan :    Active Problems:   Acute pancreatitis  Brief Summary:- 40 y.o. female with medical history significant for kidney stones, IBS admitted on 01/16/2021 with acute pancreatitis  A/p 1) acute pancreatitis----denies significant EtOH use -Fasting lipid profile noted--WNL -CT abdomen and pelvis with findings of acute pancreatitis -Gallbladder ultrasound pending -Continue IV Dilaudid as and as needed IV Toradol -WBC is down to 10.2 from 14.8 -LFTs are not elevated -IV Protonix- -Remains n.p.o. due to persistent nausea and abdominal pain -Continue IV fluids  2) tobacco abuse--- declines nicotine patch  3) class II Obesity- -Low calorie diet, portion control and increase physical activity discussed with patient -Body mass index is 34.95 kg/m.   Disposition/Need for in-Hospital Stay- patient unable to be discharged at this time due to acute pancreatitis requiring IV antiemetics and IV fluids pending tolerance of oral intake and better abdominal pain control*  Status is: Inpatient  Remains inpatient appropriate because: Please see disposition above  Disposition: The patient is from: Home              Anticipated d/c is to: Home              Anticipated d/c date is: 2 days              Patient currently is not medically stable to d/c. Barriers: Not Clinically Stable-   Code Status : -  Code Status:  Full Code   Family Communication:    NA (patient is alert, awake and coherent)   Consults  :  na  DVT Prophylaxis  :   - SCDs   enoxaparin (LOVENOX) injection 40 mg Start: 01/17/21 0800  Lab Results  Component Value Date   PLT 216 01/17/2021    Inpatient Medications  Scheduled Meds:  enoxaparin (LOVENOX) injection  40 mg Subcutaneous Q24H   norethindrone  1 tablet Oral Daily   pantoprazole (PROTONIX) IV  40 mg Intravenous Q24H   Continuous Infusions:  sodium chloride 150 mL/hr at 01/17/21 0933   dextrose 5 % and 0.9 % NaCl with KCl 20 mEq/L 125 mL/hr at 01/17/21 0600   PRN Meds:.acetaminophen **OR** acetaminophen, HYDROmorphone (DILAUDID) injection, ketorolac, ondansetron **OR** ondansetron (ZOFRAN) IV, polyethylene glycol  Anti-infectives (From admission, onward)    None         Objective:   Vitals:   01/17/21 0305 01/17/21 0329 01/17/21 0340 01/17/21 0800  BP:  110/80  124/89  Pulse:  89  88  Resp:  19  (!) 22  Temp:  98.3 F (36.8 C)  98.6 F (37 C)  TempSrc:  Oral Oral Oral  SpO2:  96%    Weight: 95.3 kg     Height: 5\' 5"  (1.651 m)       Wt Readings from Last 3 Encounters:  01/17/21 95.3 kg  11/05/20 96.2 kg  10/10/20 95.7 kg     Intake/Output Summary (Last 24 hours) at 01/17/2021 1131 Last data filed at 01/17/2021 0900 Gross per 24 hour  Intake 907.82 ml  Output --  Net 907.82 ml     Physical Exam  Gen:- Awake Alert,  in no apparent distress  HEENT:- [place].AT, No sclera icterus Neck-Supple Neck,No JVD,.  Lungs-  CTAB , fair symmetrical air movement CV- S1, S2 normal, regular  Abd-  +ve B.Sounds, Abd Soft, generalized abdominal tenderness without rebound or guarding Extremity/Skin:- No  edema, pedal pulses present  Psych-affect is appropriate, oriented x3 Neuro-no new focal deficits, no tremors   Data Review:   Micro Results No results found for this or any previous visit (from the past 240 hour(s)).  Radiology Reports CT ABDOMEN  PELVIS W CONTRAST  Result Date: 01/16/2021 CLINICAL DATA:  Left upper quadrant pain no gross left upper quadrant and lower abdominal pain EXAM: CT ABDOMEN AND PELVIS WITH CONTRAST TECHNIQUE: Multidetector CT imaging of the abdomen and pelvis was performed using the standard protocol following bolus administration of intravenous contrast. CONTRAST:  48mL OMNIPAQUE IOHEXOL 350 MG/ML SOLN COMPARISON:  10/10/2020 FINDINGS: Lower chest: Increased bibasilar atelectasis. Normal heart size. No pericardial or pleural effusion. Hepatobiliary: No focal liver abnormality is seen. No gallstones, gallbladder wall thickening, or biliary dilatation. Pancreas: Diffuse mild peripancreatic strandy edema/fluid extending into the right abdominal mesentery and along the duodenum compatible with acute pancreatitis. Pancreas continues to enhance normally. No fluid collection or ductal dilatation. No vascular complication. Spleen: Normal in size without focal abnormality. Adrenals/Urinary Tract: Normal adrenal glands. Similar bilateral nonobstructing nephrolithiasis most pronounced in the lower poles. No hydronephrosis or hydroureter. Ureters are symmetric and decompressed. Bladder collapsed. Stomach/Bowel: Stomach is within normal limits. Appendix appears normal. No evidence of bowel wall thickening, distention, or inflammatory changes. Vascular/Lymphatic: Intact aorta. Negative for aneurysm or dissection. Mesenteric and renal vasculature all appear patent. No veno-occlusive process. No bulky adenopathy. Reproductive: Uterus and adnexa normal in size. Small amount of pelvic free fluid. Other: No abdominal wall hernia or abnormality. No abdominopelvic ascites. Musculoskeletal: No acute or significant osseous findings. IMPRESSION: Mild acute pancreatitis without other complicating feature. Bibasilar atelectasis Nonobstructing bilateral nephrolithiasis Electronically Signed   By: 10/12/2020.  Shick M.D.   On: 01/16/2021 15:42     CBC Recent Labs   Lab 01/16/21 1019 01/17/21 0509  WBC 14.8* 10.2  HGB 15.5* 12.4  HCT 45.9 37.0  PLT 323 216  MCV 91.1 93.4  MCH 30.8 31.3  MCHC 33.8 33.5  RDW 12.4 12.7    Chemistries  Recent Labs  Lab 01/16/21 1019 01/17/21 0509  NA 133* 136  K 3.4* 3.7  CL 104 106  CO2 18* 24  GLUCOSE 118* 107*  BUN 19 20  CREATININE 0.65 0.49  CALCIUM 9.5 8.3*  MG 1.9  --   AST 23 15  ALT 24 17  ALKPHOS 47 37*  BILITOT 0.8 0.4   ------------------------------------------------------------------------------------------------------------------ Recent Labs    01/17/21 0509  CHOL 162  HDL 47  LDLCALC 97  TRIG 90  CHOLHDL 3.4    No results found for: HGBA1C ------------------------------------------------------------------------------------------------------------------ No results for input(s): TSH, T4TOTAL, T3FREE, THYROIDAB in the last 72 hours.  Invalid input(s): FREET3 ------------------------------------------------------------------------------------------------------------------ No results for input(s): VITAMINB12, FOLATE, FERRITIN, TIBC,  IRON, RETICCTPCT in the last 72 hours.  Coagulation profile No results for input(s): INR, PROTIME in the last 168 hours.  No results for input(s): DDIMER in the last 72 hours.  Cardiac Enzymes No results for input(s): CKMB, TROPONINI, MYOGLOBIN in the last 168 hours.  Invalid input(s): CK ------------------------------------------------------------------------------------------------------------------ No results found for: BNP   Shon Hale M.D on 01/17/2021 at 11:31 AM  Go to [email or web address] - for contact info  [facility] - Office  [phone]

## 2021-01-18 DIAGNOSIS — K859 Acute pancreatitis without necrosis or infection, unspecified: Secondary | ICD-10-CM | POA: Diagnosis not present

## 2021-01-18 DIAGNOSIS — E669 Obesity, unspecified: Secondary | ICD-10-CM | POA: Diagnosis not present

## 2021-01-18 LAB — CBC
HCT: 33.4 % — ABNORMAL LOW (ref 36.0–46.0)
Hemoglobin: 11.2 g/dL — ABNORMAL LOW (ref 12.0–15.0)
MCH: 31.1 pg (ref 26.0–34.0)
MCHC: 33.5 g/dL (ref 30.0–36.0)
MCV: 92.8 fL (ref 80.0–100.0)
Platelets: 196 10*3/uL (ref 150–400)
RBC: 3.6 MIL/uL — ABNORMAL LOW (ref 3.87–5.11)
RDW: 12.5 % (ref 11.5–15.5)
WBC: 7.9 10*3/uL (ref 4.0–10.5)
nRBC: 0 % (ref 0.0–0.2)

## 2021-01-18 LAB — GLUCOSE, CAPILLARY
Glucose-Capillary: 109 mg/dL — ABNORMAL HIGH (ref 70–99)
Glucose-Capillary: 97 mg/dL (ref 70–99)
Glucose-Capillary: 99 mg/dL (ref 70–99)

## 2021-01-18 MED ORDER — HYDROCODONE-ACETAMINOPHEN 7.5-325 MG PO TABS
1.0000 | ORAL_TABLET | Freq: Four times a day (QID) | ORAL | Status: DC | PRN
  Administered 2021-01-18 – 2021-01-19 (×3): 1 via ORAL
  Filled 2021-01-18 (×3): qty 1

## 2021-01-18 NOTE — Progress Notes (Signed)
Patient Demographics:    Stacey Wilson, is a 40 y.o. female, DOB - 1980/06/22, GYF:[id number]  Admit date - 01/16/2021   Admitting Physician Ejiroghene Wendall Stade, MD  Outpatient Primary MD for the patient is Babs Sciara, MD  LOS - 2   Chief Complaint  Patient presents with   Abdominal Pain    N/V        Subjective:    Stacey Wilson today has no fevers,    No chest pain, Nausea and abd pain improving    Assessment  & Plan :    Principal Problem:   Acute pancreatitis Active Problems:   Class 2 obesity  Brief Summary:- 40 y.o. female with medical history significant for kidney stones, IBS admitted on 01/16/2021 with Acute Pancreatitis  A/p 1)Acute Pancreatitis----appears to be idiopathic Denies significant EtOH use -Fasting Lipid profile noted--WNL -CT Abdomen and Pelvis with findings of Acute Pancreatitis -Gallbladder ultrasound with fatty liver otherwise no acute findings -WBC trending down 14.8>>10.2>>7.9 -LFTs are Not elevated -IV Protonix- -Nausea and abdominal pain improving with scheduled Toradol -Will discontinue IV Dilaudid and use p.o. hydrocodone as needed -Okay to try clear liquid diet -Continue IV fluids  2) tobacco abuse--- declines nicotine patch  3) class II Obesity- -Low calorie diet, portion control and increase physical activity discussed with patient -Body mass index is 34.95 kg/m.   Disposition/Need for in-Hospital Stay- patient unable to be discharged at this time due to acute pancreatitis requiring IV antiemetics and IV fluids and IV pain medication pending tolerance of oral intake and better abdominal pain control*  Status is: Inpatient  Remains inpatient appropriate because: Please see disposition above  Disposition: The patient is from: Home              Anticipated d/c is to: Home              Anticipated d/c date is: 1 day              Patient  currently is not medically stable to d/c. Barriers: Not Clinically Stable-   Code Status : -  Code Status: Full Code   Family Communication:    (patient is alert, awake and coherent)  Discussed with significant other at bedside on 01/18/2021 Consults  :  na  DVT Prophylaxis  :   - SCDs   enoxaparin (LOVENOX) injection 40 mg Start: 01/17/21 0800  Lab Results  Component Value Date   PLT 196 01/18/2021    Inpatient Medications  Scheduled Meds:  enoxaparin (LOVENOX) injection  40 mg Subcutaneous Q24H   ketorolac  30 mg Intravenous Q8H   norethindrone  1 tablet Oral Daily   pantoprazole (PROTONIX) IV  40 mg Intravenous Q24H   Continuous Infusions:  dextrose 5 % and 0.45% NaCl 150 mL/hr at 01/18/21 0916   PRN Meds:.acetaminophen **OR** acetaminophen, HYDROcodone-acetaminophen, ondansetron **OR** ondansetron (ZOFRAN) IV, polyethylene glycol  Anti-infectives (From admission, onward)    None         Objective:   Vitals:   01/17/21 1300 01/17/21 1559 01/17/21 2104 01/18/21 0536  BP: 120/87 126/86 131/83 123/75  Pulse: 98 95 (!) 101 94  Resp: 20 20    Temp: 98.5 F (36.9 C) 98.9 F (37.2 C) 99.2  F (37.3 C) 98.8 F (37.1 C)  TempSrc: Oral Oral Oral Oral  SpO2: 98% 95% 95% 95%  Weight:      Height:        Wt Readings from Last 3 Encounters:  01/17/21 95.3 kg  11/05/20 96.2 kg  10/10/20 95.7 kg     Intake/Output Summary (Last 24 hours) at 01/18/2021 1108 Last data filed at 01/18/2021 0900 Gross per 24 hour  Intake 2962.84 ml  Output --  Net 2962.84 ml     Physical Exam  Gen:- Awake Alert,  in no apparent distress  HEENT:- [place].AT, No sclera icterus Neck-Supple Neck,No JVD,.  Lungs-  CTAB , fair symmetrical air movement CV- S1, S2 normal, regular  Abd-  +ve B.Sounds, Abd Soft, improving abdominal tenderness without rebound or guarding, increased truncal adiposity Extremity/Skin:- No  edema, pedal pulses present  Psych-affect is appropriate, oriented  x3 Neuro-no new focal deficits, no tremors   Data Review:   Micro Results Recent Results (from the past 240 hour(s))  SARS CORONAVIRUS 2 (TAT 6-24 HRS) Nasopharyngeal Nasopharyngeal Swab     Status: None   Collection Time: 01/16/21  7:17 PM   Specimen: Nasopharyngeal Swab  Result Value Ref Range Status   SARS Coronavirus 2 NEGATIVE NEGATIVE Final    Comment: (NOTE) SARS-CoV-2 target nucleic acids are NOT DETECTED.  The SARS-CoV-2 RNA is generally detectable in upper and lower respiratory specimens during the acute phase of infection. Negative results do not preclude SARS-CoV-2 infection, do not rule out co-infections with other pathogens, and should not be used as the sole basis for treatment or other patient management decisions. Negative results must be combined with clinical observations, patient history, and epidemiological information. The expected result is Negative.  Fact Sheet for Patients: HairSlick.no  Fact Sheet for Healthcare Providers: quierodirigir.com  This test is not yet approved or cleared by the [place] FDA and  has been authorized for detection and/or diagnosis of SARS-CoV-2 by FDA under an Emergency Use Authorization (EUA). This EUA will remain  in effect (meaning this test can be used) for the duration of the COVID-19 declaration under Se ction 564(b)(1) of the Act, 21 U.S.C. section 360bbb-3(b)(1), unless the authorization is terminated or revoked sooner.  Performed at [facility], [address], [place], [place] [address]     Radiology Reports CT ABDOMEN PELVIS W CONTRAST  Result Date: 01/16/2021 CLINICAL DATA:  Left upper quadrant pain no gross left upper quadrant and lower abdominal pain EXAM: CT ABDOMEN AND PELVIS WITH CONTRAST TECHNIQUE: Multidetector CT imaging of the abdomen and pelvis was performed using the standard protocol following bolus administration of intravenous  contrast. CONTRAST:  87mL OMNIPAQUE IOHEXOL 350 MG/ML SOLN COMPARISON:  10/10/2020 FINDINGS: Lower chest: Increased bibasilar atelectasis. Normal heart size. No pericardial or pleural effusion. Hepatobiliary: No focal liver abnormality is seen. No gallstones, gallbladder wall thickening, or biliary dilatation. Pancreas: Diffuse mild peripancreatic strandy edema/fluid extending into the right abdominal mesentery and along the duodenum compatible with acute pancreatitis. Pancreas continues to enhance normally. No fluid collection or ductal dilatation. No vascular complication. Spleen: Normal in size without focal abnormality. Adrenals/Urinary Tract: Normal adrenal glands. Similar bilateral nonobstructing nephrolithiasis most pronounced in the lower poles. No hydronephrosis or hydroureter. Ureters are symmetric and decompressed. Bladder collapsed. Stomach/Bowel: Stomach is within normal limits. Appendix appears normal. No evidence of bowel wall thickening, distention, or inflammatory changes. Vascular/Lymphatic: Intact aorta. Negative for aneurysm or dissection. Mesenteric and renal vasculature all appear patent. No veno-occlusive  process. No bulky adenopathy. Reproductive: Uterus and adnexa normal in size. Small amount of pelvic free fluid. Other: No abdominal wall hernia or abnormality. No abdominopelvic ascites. Musculoskeletal: No acute or significant osseous findings. IMPRESSION: Mild acute pancreatitis without other complicating feature. Bibasilar atelectasis Nonobstructing bilateral nephrolithiasis Electronically Signed   By: Judie Petit.  Shick M.D.   On: 01/16/2021 15:42   US Abdomen Limited RUQ (LIVER/GB)  Result Date: 01/17/2021 CLINICAL DATA:  RIGHT upper quadrant pain for 3 days, pancreatitis EXAM: ULTRASOUND ABDOMEN LIMITED RIGHT UPPER QUADRANT COMPARISON:  CT abdomen and pelvis 01/16/2021 FINDINGS: Gallbladder: Normally distended without stones or wall thickening. No pericholecystic fluid or sonographic Murphy  sign. Common bile duct: Diameter: 4 mm, normal Liver: Echogenic parenchyma, likely fatty infiltration though this can be seen with cirrhosis and certain infiltrative disorders. No focal hepatic mass or nodularity. Portal vein is patent on color Doppler imaging with normal direction of blood flow towards the liver. Other: Trace perihepatic fluid.  No additional ascites. IMPRESSION: Probable fatty infiltration of liver as above. Remainder of exam unremarkable. Electronically Signed   By: Ulyses Southward M.D.   On: 01/17/2021 15:04     CBC Recent Labs  Lab 01/16/21 1019 01/17/21 0509 01/18/21 0456  WBC 14.8* 10.2 7.9  HGB 15.5* 12.4 11.2*  HCT 45.9 37.0 33.4*  PLT 323 216 196  MCV 91.1 93.4 92.8  MCH 30.8 31.3 31.1  MCHC 33.8 33.5 33.5  RDW 12.4 12.7 12.5    Chemistries  Recent Labs  Lab 01/16/21 1019 01/17/21 0509  NA 133* 136  K 3.4* 3.7  CL 104 106  CO2 18* 24  GLUCOSE 118* 107*  BUN 19 20  CREATININE 0.65 0.49  CALCIUM 9.5 8.3*  MG 1.9  --   AST 23 15  ALT 24 17  ALKPHOS 47 37*  BILITOT 0.8 0.4   ------------------------------------------------------------------------------------------------------------------ Recent Labs    01/17/21 0509  CHOL 162  HDL 47  LDLCALC 97  TRIG 90  CHOLHDL 3.4    No results found for: HGBA1C ------------------------------------------------------------------------------------------------------------------ No results for input(s): TSH, T4TOTAL, T3FREE, THYROIDAB in the last 72 hours.  Invalid input(s): FREET3 ------------------------------------------------------------------------------------------------------------------ No results for input(s): VITAMINB12, FOLATE, FERRITIN, TIBC, IRON, RETICCTPCT in the last 72 hours.  Coagulation profile No results for input(s): INR, PROTIME in the last 168 hours.  No results for input(s): DDIMER in the last 72 hours.  Cardiac Enzymes No results for input(s): CKMB, TROPONINI, MYOGLOBIN in the  last 168 hours.  Invalid input(s): CK ------------------------------------------------------------------------------------------------------------------ No results found for: BNP   Shon Hale M.D on 01/18/2021 at 11:08 AM  Go to [email or web address] - for contact info  [facility] - Office  [phone]

## 2021-01-19 DIAGNOSIS — K589 Irritable bowel syndrome without diarrhea: Secondary | ICD-10-CM | POA: Diagnosis present

## 2021-01-19 DIAGNOSIS — Z720 Tobacco use: Secondary | ICD-10-CM | POA: Diagnosis present

## 2021-01-19 DIAGNOSIS — K760 Fatty (change of) liver, not elsewhere classified: Secondary | ICD-10-CM | POA: Diagnosis present

## 2021-01-19 LAB — GLUCOSE, CAPILLARY
Glucose-Capillary: 109 mg/dL — ABNORMAL HIGH (ref 70–99)
Glucose-Capillary: 115 mg/dL — ABNORMAL HIGH (ref 70–99)
Glucose-Capillary: 97 mg/dL (ref 70–99)

## 2021-01-19 MED ORDER — OXYCODONE-ACETAMINOPHEN 5-325 MG PO TABS: 1.0000 | ORAL_TABLET | Freq: Three times a day (TID) | ORAL | 0 refills | Status: AC | PRN

## 2021-01-19 MED ORDER — PANTOPRAZOLE SODIUM 40 MG PO TBEC: 40.0000 mg | DELAYED_RELEASE_TABLET | Freq: Every day | ORAL | 1 refills | Status: DC

## 2021-01-19 MED ORDER — ACETAMINOPHEN 325 MG PO TABS: 650.0000 mg | ORAL_TABLET | Freq: Three times a day (TID) | ORAL | 0 refills | Status: DC | PRN

## 2021-01-19 NOTE — Discharge Instructions (Signed)
1) avoid fatty and greasy food due to acute Pancreatitis and fatty liver(NASH liver disease) 2)follow up with Babs Sciara, MD --within a week for recheck and reevaluation 3) complete abstinence from alcohol advised 4)-smoking cessation encouraged

## 2021-01-19 NOTE — Progress Notes (Signed)
Nsg Discharge Note  Admit Date:  01/16/2021 Discharge date: 01/19/2021   Stacey Wilson to be D/C'd Home per MD order.  AVS completed.  Copy for chart, and copy for patient signed, and dated. Patient/caregiver able to verbalize understanding. IV removed. Discharge paper work given and reviewed with patient. Patient wheeled to private vehicle. Patient stable upon discharge.   Discharge Medication: Allergies as of 01/19/2021       Reactions   Nystatin Itching        Medication List     STOP taking these medications    ondansetron 4 MG tablet Commonly known as: Zofran       TAKE these medications    acetaminophen 325 MG tablet Commonly known as: TYLENOL Take 2 tablets (650 mg total) by mouth every 8 (eight) hours as needed for mild pain (or Fever >/= 101).   Incassia 0.35 MG tablet Generic drug: norethindrone TAKE 1 TABLET BY MOUTH EVERY DAY   loratadine 10 MG tablet Commonly known as: CLARITIN Take 10 mg by mouth daily.   oxyCODONE-acetaminophen 5-325 MG tablet Commonly known as: Percocet Take 1 tablet by mouth every 8 (eight) hours as needed for up to 5 days for severe pain. What changed: when to take this   pantoprazole 40 MG tablet Commonly known as: Protonix Take 1 tablet (40 mg total) by mouth daily.   promethazine 12.5 MG tablet Commonly known as: PHENERGAN Take 12.5 mg by mouth every 6 (six) hours as needed for nausea or vomiting.   Vitamin C 500 MG Caps Take 500 mg by mouth daily.        Discharge Assessment: Vitals:   01/18/21 1955 01/19/21 0558  BP: 118/76 113/85  Pulse: 78 80  Resp: 16 16  Temp: 98.6 F (37 C) 98.2 F (36.8 C)  SpO2:  99%   Skin clean, dry and intact without evidence of skin break down, no evidence of skin tears noted. IV catheter discontinued intact. Site without signs and symptoms of complications - no redness or edema noted at insertion site, patient denies c/o pain - only slight tenderness at site.  Dressing with  slight pressure applied.  D/c Instructions-Education: Discharge instructions given to patient/family with verbalized understanding. D/c education completed with patient/family including follow up instructions, medication list, d/c activities limitations if indicated, with other d/c instructions as indicated by MD - patient able to verbalize understanding, all questions fully answered. Patient instructed to return to ED, call 911, or call MD for any changes in condition.  Patient escorted via WC, and D/C home via private auto.  Lonn Georgia, RN 01/19/2021 12:54 PM

## 2021-01-21 ENCOUNTER — Telehealth: Payer: Self-pay

## 2021-01-21 NOTE — Telephone Encounter (Signed)
Transition Care Management Unsuccessful Follow-up Telephone Call  Date of discharge and from where:  01/19/21 [facility]   Diagnosis: Acute Pancreatitis    Attempts:  1st Attempt  Reason for unsuccessful TCM follow-up call:  Left voice message

## 2021-01-21 NOTE — Telephone Encounter (Signed)
Transition Care Management Follow-up Telephone Call Date of discharge and from where: [facility] 01/19/21 Diagnosis: ACUTE PANCREATITIS. How have you been since you were released from the hospital? Pt states she is doing better, feels like she is "about 80%."  Any questions or concerns? No  Items Reviewed: Did the pt receive and understand the discharge instructions provided? Yes  Medications obtained and verified? No  Other?  Pt states she was given Rx for Protonix but it is $80.00 for 30 pills so she did not pick up.  Any new allergies since your discharge? No  Dietary orders reviewed? Yes Do you have support at home? Yes   Home Care and Equipment/Supplies: Were home health services ordered? no If so, what is the name of the agency? N/A  Has the agency set up a time to come to the patient's home? not applicable Were any new equipment or medical supplies ordered?  No What is the name of the medical supply agency? N/A Were you able to get the supplies/equipment? not applicable Do you have any questions related to the use of the equipment or supplies? No  Functional Questionnaire: (I = Independent and D = Dependent) ADLs: I  Bathing/Dressing- I  Meal Prep- I  Eating- I  Maintaining continence- I  Transferring/Ambulation- I  Managing Meds- I  Follow up appointments reviewed:  PCP Hospital f/u appt confirmed? Yes  Scheduled to see Dr. Gerda Diss on 01/22/21 @ 4:15pm. Specialist Hospital f/u appt confirmed?  N/A   Are transportation arrangements needed? No  If their condition worsens, is the pt aware to call PCP or go to the Emergency Dept.? Yes Was the patient provided with contact information for the PCP's office or ED? Yes Was to pt encouraged to call back with questions or concerns? Yes PT ASKS IF THERE IS A CHEAPER RX THAN PROTONIX BECAUSE IT IS $80.00 FOR 30 PILLS WITH HER INSURANCE. Thank you!!

## 2021-01-22 ENCOUNTER — Encounter: Payer: Self-pay | Admitting: Family Medicine

## 2021-01-22 ENCOUNTER — Other Ambulatory Visit: Payer: Self-pay

## 2021-01-22 ENCOUNTER — Ambulatory Visit (INDEPENDENT_AMBULATORY_CARE_PROVIDER_SITE_OTHER): Payer: Managed Care, Other (non HMO) | Admitting: Family Medicine

## 2021-01-22 VITALS — BP 121/86 | HR 99 | Temp 97.8°F | Ht 65.0 in | Wt 207.0 lb

## 2021-01-22 DIAGNOSIS — Z79899 Other long term (current) drug therapy: Secondary | ICD-10-CM

## 2021-01-22 DIAGNOSIS — Z13220 Encounter for screening for lipoid disorders: Secondary | ICD-10-CM | POA: Diagnosis not present

## 2021-01-22 DIAGNOSIS — K760 Fatty (change of) liver, not elsewhere classified: Secondary | ICD-10-CM

## 2021-01-22 DIAGNOSIS — K859 Acute pancreatitis without necrosis or infection, unspecified: Secondary | ICD-10-CM

## 2021-01-22 NOTE — Progress Notes (Signed)
   Subjective:    Patient ID: Stacey Wilson, female    DOB: [birth year], 40 y.o.   MRN: [id number]  HPI Hospital follow up  Hospital follow-up We did discuss in detail her CAT scan and ultrasound lab work and hospital stay.  We also discussed the symptom onset how her symptoms when in the hospital and how her symptoms are currently She is having some problems with epigastric pain currently is on a soft diet.  Drinking plenty of liquids.  Patient denies alcohol use.  Denies drug use.  No known family history of pancreatic disease  Review of Systems     Objective:   Physical Exam  Lungs are clear heart regular HEENT benign neck normal epigastric area moderate tenderness no guarding or rebound      Assessment & Plan:  1. Acute pancreatitis, unspecified complication status, unspecified pancreatitis type Check lipase soft diet soft fruits vegetables lean meats referral to gastroenterology idiopathic - Lipase - Comprehensive Metabolic Panel (CMET) - CBC with Differential - Lipid Profile - Ambulatory referral to Gastroenterology  2. Fatty liver Fatty liver referral to gastroenterology and reassurance given to the patient previous liver enzymes look good repeat liver enzymes look at potassium again - Lipase - Comprehensive Metabolic Panel (CMET) - CBC with Differential - Lipid Profile - Ambulatory referral to Gastroenterology  3. Screening, lipid Make sure patient does not have extremely elevated triglycerides healthy diet rarely drinks alcohol - Lipase - Comprehensive Metabolic Panel (CMET) - CBC with Differential - Lipid Profile - Ambulatory referral to Gastroenterology  4. High risk medication use Labs ordered - Lipase - Comprehensive Metabolic Panel (CMET) - CBC with Differential - Lipid Profile - Ambulatory referral to Gastroenterology  Work excuse given through next Monday.  Return to work next Tuesday.  If still having significant abdominal pain and discomfort neck  step would be keeping her out the rest of next week  I did encourage her to eat healthy and try to lose weight.  I do not feel that she has cirrhosis.  She will follow-up with gastroenterology.

## 2021-01-23 ENCOUNTER — Encounter (INDEPENDENT_AMBULATORY_CARE_PROVIDER_SITE_OTHER): Payer: Self-pay | Admitting: *Deleted

## 2021-01-23 LAB — CBC WITH DIFFERENTIAL/PLATELET
Basophils Absolute: 0 10*3/uL (ref 0.0–0.2)
Basos: 1 %
EOS (ABSOLUTE): 0.2 10*3/uL (ref 0.0–0.4)
Eos: 3 %
Hematocrit: 40.1 % (ref 34.0–46.6)
Hemoglobin: 14 g/dL (ref 11.1–15.9)
Immature Grans (Abs): 0 10*3/uL (ref 0.0–0.1)
Immature Granulocytes: 0 %
Lymphocytes Absolute: 1.7 10*3/uL (ref 0.7–3.1)
Lymphs: 21 %
MCH: 30.3 pg (ref 26.6–33.0)
MCHC: 34.9 g/dL (ref 31.5–35.7)
MCV: 87 fL (ref 79–97)
Monocytes Absolute: 0.7 10*3/uL (ref 0.1–0.9)
Monocytes: 8 %
Neutrophils Absolute: 5.4 10*3/uL (ref 1.4–7.0)
Neutrophils: 67 %
Platelets: 329 10*3/uL (ref 150–450)
RBC: 4.62 x10E6/uL (ref 3.77–5.28)
RDW: 11.9 % (ref 11.7–15.4)
WBC: 8 10*3/uL (ref 3.4–10.8)

## 2021-01-23 LAB — COMPREHENSIVE METABOLIC PANEL
ALT: 22 IU/L (ref 0–32)
AST: 25 IU/L (ref 0–40)
Albumin/Globulin Ratio: 1.6 (ref 1.2–2.2)
Albumin: 4.6 g/dL (ref 3.8–4.8)
Alkaline Phosphatase: 62 IU/L (ref 44–121)
BUN/Creatinine Ratio: 11 (ref 9–23)
BUN: 7 mg/dL (ref 6–20)
Bilirubin Total: 0.3 mg/dL (ref 0.0–1.2)
CO2: 21 mmol/L (ref 20–29)
Calcium: 9.5 mg/dL (ref 8.7–10.2)
Chloride: 102 mmol/L (ref 96–106)
Creatinine, Ser: 0.63 mg/dL (ref 0.57–1.00)
Globulin, Total: 2.9 g/dL (ref 1.5–4.5)
Glucose: 85 mg/dL (ref 70–99)
Potassium: 4.6 mmol/L (ref 3.5–5.2)
Sodium: 137 mmol/L (ref 134–144)
Total Protein: 7.5 g/dL (ref 6.0–8.5)
eGFR: 116 mL/min/{1.73_m2} (ref >59)

## 2021-01-23 LAB — LIPID PANEL
Chol/HDL Ratio: 6.6 ratio — ABNORMAL HIGH (ref 0.0–4.4)
Cholesterol, Total: 246 mg/dL — ABNORMAL HIGH (ref 100–199)
HDL: 37 mg/dL — ABNORMAL LOW (ref >39)
LDL Chol Calc (NIH): 183 mg/dL — ABNORMAL HIGH (ref 0–99)
Triglycerides: 140 mg/dL (ref 0–149)
VLDL Cholesterol Cal: 26 mg/dL (ref 5–40)

## 2021-01-23 LAB — LIPASE: Lipase: 19 U/L (ref 14–72)

## 2021-01-25 ENCOUNTER — Encounter: Payer: Self-pay | Admitting: Family Medicine

## 2021-01-25 NOTE — Telephone Encounter (Signed)
Hi Please send Rowen a work note stating that she can go back to work without restrictions on Monday.  Please also pass this message along to her  Hi Keanna I am glad that you are doing better.  Should you have any symptoms that would indicate the return of this problem please notify us right away so we can run appropriate testing and see you.  If you should have further troubles questions or issues please connect with [place] thank you-Dr. Lorin Picket

## 2021-01-28 ENCOUNTER — Encounter: Payer: Self-pay | Admitting: Family Medicine

## 2021-02-02 NOTE — Discharge Summary (Signed)
Stacey Wilson, is a 40 y.o. female  DOB [birth year]  MRN [id number].  Admission date:  01/16/2021  Admitting Physician  Onnie Boer, MD  Discharge Date:  02/02/2021   Primary MD  Babs Sciara, MD  Recommendations for primary care physician for things to follow:    Admission Diagnosis  Acute pancreatitis [K85.90] Acute pancreatitis, unspecified complication status, unspecified pancreatitis type [K85.90]  Discharge Diagnosis  Acute pancreatitis [K85.90] Acute pancreatitis, unspecified complication status, unspecified pancreatitis type [K85.90]   Principal Problem:   Acute pancreatitis Active Problems:   NAFL (nonalcoholic fatty liver)/NASH   IBS (irritable bowel syndrome)   Class 2 obesity   Tobacco abuse      Past Medical History:  Diagnosis Date   Anxiety    History of kidney stones    Renal calculi    bilateral   Urgency of urination    Warts, genital     Past Surgical History:  Procedure Laterality Date   BIOPSY  01/10/2020   Procedure: BIOPSY;  Surgeon: Dolores Frame, MD;  Location: [facility];  Service: Gastroenterology;;   BREAST SURGERY Right 2002   benign cyst   COLONOSCOPY WITH PROPOFOL N/A 01/10/2020   Procedure: COLONOSCOPY WITH PROPOFOL;  Surgeon: Dolores Frame, MD;  Location: [facility];  Service: Gastroenterology;  Laterality: N/A;  730   CYSTOSCOPY W/ URETERAL STENT PLACEMENT Bilateral 05/19/2016   Procedure: CYSTOSCOPY WITH RETROGRADE PYELOGRAM/URETEROSCOPY/STONE EXTRACTION WITH BASKET / STENT PLACEMENT;  Surgeon: Malen Gauze, MD;  Location: [facility];  Service: Urology;  Laterality: Bilateral;   CYSTOSCOPY WITH RETROGRADE PYELOGRAM, URETEROSCOPY AND STENT PLACEMENT Bilateral 07/25/2015   Procedure: CYSTOSCOPY WITH BILATERAL RETROGRADE PYELOGRAM, BILATERAL URETEROSCOPY AND BILATERAL STENT PLACEMENT;  Surgeon:  Malen Gauze, MD;  Location: [facility];  Service: Urology;  Laterality: Bilateral;   CYSTOSCOPY WITH RETROGRADE PYELOGRAM, URETEROSCOPY AND STENT PLACEMENT Left 11/07/2020   Procedure: CYSTOSCOPY WITH RETROGRADE PYELOGRAM, URETEROSCOPY AND STENT PLACEMENT;  Surgeon: Malen Gauze, MD;  Location: [facility];  Service: Urology;  Laterality: Left;   STONE EXTRACTION WITH BASKET Bilateral 07/25/2015   Procedure: BILATERAL RENAL STONE EXTRACTION WITH BASKET;  Surgeon: Malen Gauze, MD;  Location: [facility];  Service: Urology;  Laterality: Bilateral;     HPI  from the history and physical done on the day of admission:    Chief Complaint: Abdominal pain   HPI: Stacey Wilson is a 40 y.o. female with medical history significant for kidney stones, IBS , she presented with complaints of severe abdominal pain that started at about 1:30 in the morning.  Pain is in her upper abdomen, and radiates around her ribs to her back.  With persistence of pain despite several conservative measures she took presented to ED. She reports similar episode of vomiting.  No diarrhea.  No pain with urination. No prior hx of pancreatitis of similar presentation.  Drinks alcoholic beverages very occasionally.   ED Course: Temperature 97.9.  Heart rate 72-90.  Respiratory 18-24.  Blood pressure systolic 130s to 341P.  Lipase 291, WBC 14.8.  UA shows many bacteria.  Sodium 133.  Potassium 3.4.  Abdominal CT shows mild acute pancreatitis, no gallstones, no dilatation of the biliary tract. Female smoker, regarding multiple, patient required IV Dilaudid for pain relief.  Hospitalist to admit.   Review of Systems: As per HPI all other systems reviewed and negative.   Hospital Course:      Brief Summary:- 40 y.o. female with medical history significant for kidney stones, IBS admitted on 01/16/2021 with Acute Pancreatitis   A/p 1)Acute Pancreatitis----appears to be idiopathic Denies significant EtOH use -Fasting Lipid  profile noted--WNL -CT Abdomen and Pelvis with findings of Acute Pancreatitis -Gallbladder ultrasound with fatty liver otherwise no acute findings -WBC trending down 14.8>>10.2>>7.9 -LFTs are Not elevated Treated with IV Protonix okay to discharge on oral Protonix -Tolerating oral intake well, IV fluids discontinued -IV opiates discontinued okay to discharge on p.o. Percocet   2)Tobacco Abuse--- smoking cessation advised, declines nicotine patch   3) class II Obesity- -Low calorie diet, portion control and increase physical activity discussed with patient -Body mass index is 34.95 kg/m.     Disposition--- Home    Disposition: The patient is from: Home              Anticipated d/c is to: Home               Code Status : -  Code Status: Full Code    Family Communication:    (patient is alert, awake and coherent)  Discussed with significant other at bedside on 01/18/2021  Discharge Condition: stable  Follow UP--- with PCP as outpatient  Diet and Activity recommendation:  As advised  Discharge Instructions    Discharge Instructions     Call MD for:  persistant dizziness or light-headedness   Complete by: As directed    Call MD for:  persistant nausea and vomiting   Complete by: As directed    Call MD for:  severe uncontrolled pain   Complete by: As directed    Call MD for:  temperature >100.4   Complete by: As directed    Diet - low sodium heart healthy   Complete by: As directed    Discharge instructions   Complete by: As directed    1) avoid fatty and greasy food due to acute Pancreatitis and fatty liver(NASH liver disease) 2)follow up with Babs Sciara, MD --within a week for recheck and reevaluation 3) complete abstinence from alcohol advised 4)-smoking cessation encouraged   Increase activity slowly   Complete by: As directed          Discharge Medications     Allergies as of 01/19/2021       Reactions   Nystatin Itching        Medication  List     STOP taking these medications    ondansetron 4 MG tablet Commonly known as: Zofran   oxyCODONE-acetaminophen 5-325 MG tablet Commonly known as: Percocet       TAKE these medications    acetaminophen 325 MG tablet Commonly known as: TYLENOL Take 2 tablets (650 mg total) by mouth every 8 (eight) hours as needed for mild pain (or Fever >/= 101).   Incassia 0.35 MG tablet Generic drug: norethindrone TAKE 1 TABLET BY MOUTH EVERY DAY   loratadine 10 MG tablet Commonly known as: CLARITIN Take 10 mg by mouth daily.   pantoprazole 40 MG tablet Commonly known as: Protonix Take  1 tablet (40 mg total) by mouth daily.   promethazine 12.5 MG tablet Commonly known as: PHENERGAN Take 12.5 mg by mouth every 6 (six) hours as needed for nausea or vomiting.   Vitamin C 500 MG Caps Take 500 mg by mouth daily.       ASK your doctor about these medications    oxyCODONE-acetaminophen 5-325 MG tablet Commonly known as: Percocet Take 1 tablet by mouth every 8 (eight) hours as needed for up to 5 days for severe pain. Ask about: Should I take this medication?        Major procedures and Radiology Reports - PLEASE review detailed and final reports for all details, in brief -   CT ABDOMEN PELVIS W CONTRAST  Result Date: 01/16/2021 CLINICAL DATA:  Left upper quadrant pain no gross left upper quadrant and lower abdominal pain EXAM: CT ABDOMEN AND PELVIS WITH CONTRAST TECHNIQUE: Multidetector CT imaging of the abdomen and pelvis was performed using the standard protocol following bolus administration of intravenous contrast. CONTRAST:  66mL OMNIPAQUE IOHEXOL 350 MG/ML SOLN COMPARISON:  10/10/2020 FINDINGS: Lower chest: Increased bibasilar atelectasis. Normal heart size. No pericardial or pleural effusion. Hepatobiliary: No focal liver abnormality is seen. No gallstones, gallbladder wall thickening, or biliary dilatation. Pancreas: Diffuse mild peripancreatic strandy edema/fluid  extending into the right abdominal mesentery and along the duodenum compatible with acute pancreatitis. Pancreas continues to enhance normally. No fluid collection or ductal dilatation. No vascular complication. Spleen: Normal in size without focal abnormality. Adrenals/Urinary Tract: Normal adrenal glands. Similar bilateral nonobstructing nephrolithiasis most pronounced in the lower poles. No hydronephrosis or hydroureter. Ureters are symmetric and decompressed. Bladder collapsed. Stomach/Bowel: Stomach is within normal limits. Appendix appears normal. No evidence of bowel wall thickening, distention, or inflammatory changes. Vascular/Lymphatic: Intact aorta. Negative for aneurysm or dissection. Mesenteric and renal vasculature all appear patent. No veno-occlusive process. No bulky adenopathy. Reproductive: Uterus and adnexa normal in size. Small amount of pelvic free fluid. Other: No abdominal wall hernia or abnormality. No abdominopelvic ascites. Musculoskeletal: No acute or significant osseous findings. IMPRESSION: Mild acute pancreatitis without other complicating feature. Bibasilar atelectasis Nonobstructing bilateral nephrolithiasis Electronically Signed   By: Judie Petit.  Shick M.D.   On: 01/16/2021 15:42   US Abdomen Limited RUQ (LIVER/GB)  Result Date: 01/17/2021 CLINICAL DATA:  RIGHT upper quadrant pain for 3 days, pancreatitis EXAM: ULTRASOUND ABDOMEN LIMITED RIGHT UPPER QUADRANT COMPARISON:  CT abdomen and pelvis 01/16/2021 FINDINGS: Gallbladder: Normally distended without stones or wall thickening. No pericholecystic fluid or sonographic Murphy sign. Common bile duct: Diameter: 4 mm, normal Liver: Echogenic parenchyma, likely fatty infiltration though this can be seen with cirrhosis and certain infiltrative disorders. No focal hepatic mass or nodularity. Portal vein is patent on color Doppler imaging with normal direction of blood flow towards the liver. Other: Trace perihepatic fluid.  No additional  ascites. IMPRESSION: Probable fatty infiltration of liver as above. Remainder of exam unremarkable. Electronically Signed   By: Ulyses Southward M.D.   On: 01/17/2021 15:04    Micro Results   No results found for this or any previous visit (from the past 240 hour(s)).  Today   Subjective    Brianny Soulliere today has no new complaints  No fever  Or chills  -  No Nausea, Vomiting or Diarrhea Tolerating oral intake---  Patient has been seen and examined prior to discharge   Objective   Blood pressure 113/85, pulse 80, temperature 98.2 F (36.8 C), temperature source Oral, resp. rate 16, height 5'  5" (1.651 m), weight 95.3 kg, last menstrual period 01/10/2021, SpO2 99 %.  No intake or output data in the 24 hours ending 02/02/21 1753  Exam Gen:- Awake Alert,  in no apparent distress  HEENT:- [place].AT, No sclera icterus Neck-Supple Neck,No JVD,.  Lungs-  CTAB , fair symmetrical air movement CV- S1, S2 normal, regular  Abd-  +ve B.Sounds, Abd Soft, No significant abdominal tenderness without rebound or guarding, increased truncal adiposity Extremity/Skin:- No  edema, pedal pulses present  Psych-affect is appropriate, oriented x3 Neuro-no new focal deficits, no tremors   Data Review   CBC w Diff:  Lab Results  Component Value Date   WBC 8.0 01/22/2021   WBC 7.9 01/18/2021   HGB 14.0 01/22/2021   HCT 40.1 01/22/2021   PLT 329 01/22/2021   LYMPHOPCT 24 07/19/2015   MONOPCT 7 07/19/2015   EOSPCT 1 07/19/2015   BASOPCT 0 07/19/2015    CMP:  Lab Results  Component Value Date   NA 137 01/22/2021   K 4.6 01/22/2021   CL 102 01/22/2021   CO2 21 01/22/2021   BUN 7 01/22/2021   CREATININE 0.63 01/22/2021   PROT 7.5 01/22/2021   ALBUMIN 4.6 01/22/2021   BILITOT 0.3 01/22/2021   ALKPHOS 62 01/22/2021   AST 25 01/22/2021   ALT 22 01/22/2021    Total Discharge time is about 33 minutes  Shon Hale M.D on 02/02/2021 at 5:53 PM  Go to [email or web address] -  for contact  info  [facility] - Office  [phone]

## 2021-02-04 ENCOUNTER — Ambulatory Visit (INDEPENDENT_AMBULATORY_CARE_PROVIDER_SITE_OTHER): Payer: Managed Care, Other (non HMO) | Admitting: Gastroenterology

## 2021-02-07 ENCOUNTER — Encounter: Payer: Self-pay | Admitting: Family Medicine

## 2021-02-13 ENCOUNTER — Other Ambulatory Visit: Payer: Self-pay

## 2021-02-13 ENCOUNTER — Ambulatory Visit (INDEPENDENT_AMBULATORY_CARE_PROVIDER_SITE_OTHER): Payer: Managed Care, Other (non HMO) | Admitting: Family Medicine

## 2021-02-13 DIAGNOSIS — E785 Hyperlipidemia, unspecified: Secondary | ICD-10-CM

## 2021-02-13 NOTE — Progress Notes (Signed)
   Subjective:    Patient ID: Stacey Wilson, female    DOB: Feb 06, 1981, 40 y.o.   MRN: [id number]  HPI  Pancreatitis follow up  She denies any pancreatitis symptoms No abdominal pain or discomforts She did do recent lab work this was reviewed with the patient Has significant hyperlipidemia Patient states she is done a dramatic dietary change She is also working toward quitting smoking she smokes approximately a pack per day Review of Systems     Objective:   Physical Exam  Lungs clear heart regular pulse normal BP good      Assessment & Plan:  Pancreatitis-resolved.  Warning signs discussed.  Patient does not drink much alcohol at all and occasional glass of wine would be acceptable.  Avoid any daily use.  If she starts having frequent pancreatitis further evaluation including evaluating for autoimmune issues would be warranted.  Potentially gastroenterology consult.  Severe hyperlipidemia continue dietary measures check labs again by early spring if not seen dramatic improvement consideration for medications  Counseling given to the patient regarding quitting smoking.  Various ways of approaching this discussed.  Recommend patient to give strong consideration to move forward with either nicotine patch, Wellbutrin, or Chantix side effects were discussed.

## 2021-02-25 ENCOUNTER — Encounter: Payer: Self-pay | Admitting: Family Medicine

## 2021-02-26 ENCOUNTER — Telehealth: Payer: Self-pay | Admitting: Family Medicine

## 2021-02-26 NOTE — Telephone Encounter (Signed)
Front  Is given work excuse may return to work on Friday

## 2021-02-26 NOTE — Telephone Encounter (Signed)
Unfortunately I am unable to send in antibiotics without being seen due to medical protocols  Please work with her I am willing to work her in at the end of the day today or at the end of the afternoon tomorrow.  Unfortunately being the only provider for this full week our schedule is quite more than full etc.  Thank you

## 2021-02-27 ENCOUNTER — Encounter: Payer: Self-pay | Admitting: Family Medicine

## 2021-02-27 ENCOUNTER — Other Ambulatory Visit: Payer: Self-pay

## 2021-02-27 ENCOUNTER — Ambulatory Visit (INDEPENDENT_AMBULATORY_CARE_PROVIDER_SITE_OTHER): Payer: Managed Care, Other (non HMO) | Admitting: Family Medicine

## 2021-02-27 VITALS — HR 110 | Temp 98.8°F | Ht 65.0 in | Wt 198.0 lb

## 2021-02-27 DIAGNOSIS — B9689 Other specified bacterial agents as the cause of diseases classified elsewhere: Secondary | ICD-10-CM | POA: Diagnosis not present

## 2021-02-27 DIAGNOSIS — J019 Acute sinusitis, unspecified: Secondary | ICD-10-CM | POA: Diagnosis not present

## 2021-02-27 MED ORDER — AMOXICILLIN-POT CLAVULANATE 875-125 MG PO TABS: 1.0000 | ORAL_TABLET | Freq: Two times a day (BID) | ORAL | 0 refills | Status: DC

## 2021-02-27 NOTE — Progress Notes (Signed)
   Subjective:    Patient ID: Stacey Wilson, female    DOB: 1980-08-25, 40 y.o.   MRN: [id number]  HPI Patient is here with sinusitis x 1 week - OTC taking dayquil , nyquil , claritin, floanase Symptoms over the past several days did negative COVID testing several days ago denies high fever chills sweats relates sinus pressure pain discomfort no wheezing difficulty breathing  Review of Systems     Objective:   Physical Exam Eardrums are normal mild tenderness around the right eyes sinus area maxillary bilateral lungs clear no respiratory distress       Assessment & Plan:  Sinusitis Probably triggered by a virus Augmentin twice daily for 14 days Follow-up if progressive troubles call if any issues

## 2021-03-04 ENCOUNTER — Ambulatory Visit (HOSPITAL_COMMUNITY): Payer: Managed Care, Other (non HMO) | Attending: Urology

## 2021-03-11 ENCOUNTER — Ambulatory Visit: Payer: Managed Care, Other (non HMO) | Admitting: Urology

## 2021-04-02 ENCOUNTER — Encounter: Payer: Self-pay | Admitting: Family Medicine

## 2021-04-03 NOTE — Telephone Encounter (Signed)
Please provide the necessary work note for her based upon her most recent transmission thank you

## 2021-04-03 NOTE — Telephone Encounter (Signed)
Please give her a work note as requested thank you

## 2021-04-04 ENCOUNTER — Encounter: Payer: Self-pay | Admitting: Family Medicine

## 2021-07-16 ENCOUNTER — Ambulatory Visit: Payer: Managed Care, Other (non HMO) | Admitting: Urology

## 2021-07-22 ENCOUNTER — Ambulatory Visit: Payer: Managed Care, Other (non HMO) | Admitting: Urology

## 2021-07-22 DIAGNOSIS — N200 Calculus of kidney: Secondary | ICD-10-CM

## 2021-08-14 ENCOUNTER — Ambulatory Visit: Payer: Managed Care, Other (non HMO) | Admitting: Family Medicine

## 2021-11-01 ENCOUNTER — Other Ambulatory Visit: Payer: Self-pay | Admitting: Adult Health

## 2022-04-14 ENCOUNTER — Other Ambulatory Visit (HOSPITAL_COMMUNITY)
Admission: RE | Admit: 2022-04-14 | Discharge: 2022-04-14 | Disposition: A | Discharge status: Home / Self Care | Payer: Managed Care, Other (non HMO) | Source: Ambulatory Visit | Attending: Family Medicine | Admitting: Family Medicine

## 2022-04-14 ENCOUNTER — Ambulatory Visit (INDEPENDENT_AMBULATORY_CARE_PROVIDER_SITE_OTHER): Payer: Managed Care, Other (non HMO) | Admitting: Family Medicine

## 2022-04-14 VITALS — BP 106/69 | HR 77 | Temp 97.9°F | Ht 65.0 in | Wt 187.0 lb

## 2022-04-14 DIAGNOSIS — R110 Nausea: Secondary | ICD-10-CM | POA: Insufficient documentation

## 2022-04-14 DIAGNOSIS — R1010 Upper abdominal pain, unspecified: Secondary | ICD-10-CM | POA: Diagnosis present

## 2022-04-14 LAB — BASIC METABOLIC PANEL
Anion gap: 8 (ref 5–15)
BUN: 16 mg/dL (ref 6–20)
CO2: 22 mmol/L (ref 22–32)
Calcium: 9 mg/dL (ref 8.9–10.3)
Chloride: 103 mmol/L (ref 98–111)
Creatinine, Ser: 0.61 mg/dL (ref 0.44–1.00)
GFR, Estimated: >60 mL/min (ref >60)
Glucose, Bld: 85 mg/dL (ref 70–99)
Potassium: 4 mmol/L (ref 3.5–5.1)
Sodium: 133 mmol/L — ABNORMAL LOW (ref 135–145)

## 2022-04-14 LAB — CBC WITH DIFFERENTIAL/PLATELET
Abs Immature Granulocytes: 0.02 10*3/uL (ref 0.00–0.07)
Basophils Absolute: 0 10*3/uL (ref 0.0–0.1)
Basophils Relative: 1 %
Eosinophils Absolute: 0.1 10*3/uL (ref 0.0–0.5)
Eosinophils Relative: 1 %
HCT: 38.8 % (ref 36.0–46.0)
Hemoglobin: 13.2 g/dL (ref 12.0–15.0)
Immature Granulocytes: 0 %
Lymphocytes Relative: 44 %
Lymphs Abs: 2.9 10*3/uL (ref 0.7–4.0)
MCH: 30.1 pg (ref 26.0–34.0)
MCHC: 34 g/dL (ref 30.0–36.0)
MCV: 88.4 fL (ref 80.0–100.0)
Monocytes Absolute: 0.7 10*3/uL (ref 0.1–1.0)
Monocytes Relative: 10 %
Neutro Abs: 2.9 10*3/uL (ref 1.7–7.7)
Neutrophils Relative %: 44 %
Platelets: 232 10*3/uL (ref 150–400)
RBC: 4.39 MIL/uL (ref 3.87–5.11)
RDW: 12 % (ref 11.5–15.5)
WBC: 6.6 10*3/uL (ref 4.0–10.5)
nRBC: 0 % (ref 0.0–0.2)

## 2022-04-14 LAB — HEPATIC FUNCTION PANEL
ALT: 34 U/L (ref 0–44)
AST: 23 U/L (ref 15–41)
Albumin: 4.3 g/dL (ref 3.5–5.0)
Alkaline Phosphatase: 36 U/L — ABNORMAL LOW (ref 38–126)
Bilirubin, Direct: 0.1 mg/dL (ref 0.0–0.2)
Indirect Bilirubin: 0.8 mg/dL (ref 0.3–0.9)
Total Bilirubin: 0.9 mg/dL (ref 0.3–1.2)
Total Protein: 7.2 g/dL (ref 6.5–8.1)

## 2022-04-14 LAB — LIPASE, BLOOD: Lipase: 24 U/L (ref 11–51)

## 2022-04-14 MED ORDER — PANTOPRAZOLE SODIUM 40 MG PO TBEC: 40.0000 mg | DELAYED_RELEASE_TABLET | Freq: Every day | ORAL | 3 refills | Status: DC

## 2022-04-14 NOTE — Progress Notes (Signed)
   Subjective:    Patient ID: Stacey Wilson, female    DOB: 1981-04-08, 41 y.o.   MRN: [id number]  HPI  Upper abdominal pain radiating to back x 1 week Hx of pancreatitis , has not eaten any solid foods since Saturday x 2 days Upper back pain Radiates to the back History of pancreatitis Does not drink Does smoke Has lost weight Try to eat healthy stay active Pain started about a week ago and epigastric area radiates to the side and to her back very difficult to rest or sleep very difficult to work some nausea occasional vomiting  Review of Systems     Objective:   Physical Exam General-in no acute distress Eyes-no discharge Lungs-respiratory rate normal, CTA CV-no murmurs,RRR Extremities skin warm dry no edema Neuro grossly normal Behavior normal, alert  Epigastric moderate tenderness and mid abdominal tenderness negative flank tenderness      Assessment & Plan:  1. Upper abdominal pain Needs stat labs Await the results May need ultrasound Do not feel she needs CAT scan No work this week Rest bland diet clear liquids - CBC with Differential/Platelet - Basic metabolic panel - Hepatic function panel - Lipase  2. Nausea Await labs.  Zofran as needed for nausea.  No work this week. - CBC with Differential/Platelet - Basic metabolic panel - Hepatic function panel - Lipase

## 2022-04-14 NOTE — Patient Instructions (Signed)
Nicotine Patches What is this medication? NICOTINE (NIK [place]) helps you quit smoking. It reduces cravings for nicotine, the addictive substance found in tobacco. It may also help reduce symptoms of withdrawal. It is most effective when used in combination with a stop-smoking program. This medicine may be used for other purposes; ask your health care provider or pharmacist if you have questions. COMMON BRAND NAME(S): Habitrol, Nicoderm CQ, Nicotrol What should I tell my care team before I take this medication? They need to know if you have any of these conditions: Diabetes Heart disease, angina, irregular heartbeat or previous heart attack High blood pressure Lung disease, including asthma Overactive thyroid Pheochromocytoma Seizures or a history of seizures Skin problems, like eczema Stomach problems or ulcers An unusual or allergic reaction to nicotine, adhesives, other medications, foods, dyes, or preservatives Pregnant or trying to get pregnant Breast-feeding How should I use this medication? This medication is for external use only. Follow the directions on the label. Do not cut or trim the patch. After applying the patch, wash your hands. Change the patch every day in the morning, keeping to a regular schedule. When you apply a new patch, use a new area of skin. Wait at least 1 week before using the same area again. This medication comes with INSTRUCTIONS FOR USE. Ask your pharmacist for directions on how to use this medication. Read the information carefully. Talk to your pharmacist or care team if you have questions. Talk to your care team about the use of this medication in children. Special care may be needed. Overdosage: If you think you have taken too much of this medicine contact a poison control center or emergency room at once. NOTE: This medicine is only for you. Do not share this medicine with others. What if I miss a dose? If you forget to replace a patch, use it as soon  as you can. Only use one patch at a time and do not leave on the skin for longer than directed. If a patch falls off, you can replace it, but keep to your schedule and remove the patch at the right time. What may interact with this medication? Certain medications for lung or breathing disease, like asthma Medications for blood pressure Medications for depression This list may not describe all possible interactions. Give your health care provider a list of all the medicines, herbs, non-prescription drugs, or dietary supplements you use. Also tell them if you smoke, drink alcohol, or use illegal drugs. Some items may interact with your medicine. What should I watch for while using this medication? Visit your care team for regular checks on your progress. Talk to your care team about what you can do to improve your chances of quitting. If you are going to need surgery, an MRI, CT scan, or other procedure, tell your care team that you are using this medication. You may need to remove the patch before the procedure. What side effects may I notice from receiving this medication? Side effects that you should report to your care team as soon as possible: Allergic reactions--skin rash, itching, hives, swelling of the face, lips, tongue, or throat Heart palpitations--rapid, pounding, or irregular heartbeat Increase in blood pressure Side effects that usually do not require medical attention (report to your care team if they continue or are bothersome): Dizziness Headache Heartburn Hiccups Irritation at application site Trouble sleeping This list may not describe all possible side effects. Call your doctor for medical advice about side effects. You may   report side effects to FDA at 1-800-FDA-1088. Where should I keep my medication? This product may have enough nicotine in it to make children and pets sick. Keep it away from children and pets. After using, throw away as directed on the package. Store at  room temperature between 20 and 25 degrees C (68 and 77 degrees F). Protect from heat and light. Keep this medication in the original container until ready to use. Throw away unused medication after the expiration date. NOTE: This sheet is a summary. It may not cover all possible information. If you have questions about this medicine, talk to your doctor, pharmacist, or health care provider.  2023 Elsevier/Gold Standard (2020-06-18 00:00:00)  

## 2022-09-02 ENCOUNTER — Ambulatory Visit (HOSPITAL_COMMUNITY)
Admission: RE | Admit: 2022-09-02 | Discharge: 2022-09-02 | Disposition: A | Discharge status: Home / Self Care | Payer: Managed Care, Other (non HMO) | Source: Ambulatory Visit | Attending: Urology | Admitting: Urology

## 2022-09-02 ENCOUNTER — Ambulatory Visit (INDEPENDENT_AMBULATORY_CARE_PROVIDER_SITE_OTHER): Payer: Managed Care, Other (non HMO) | Admitting: Urology

## 2022-09-02 VITALS — BP 108/74 | HR 83

## 2022-09-02 DIAGNOSIS — N200 Calculus of kidney: Secondary | ICD-10-CM | POA: Diagnosis not present

## 2022-09-02 NOTE — Progress Notes (Signed)
09/02/2022 2:49 PM   Stacey Wilson 08/04/1980 [id number]  Referring provider: Babs Sciara, MD [address] [place],  [place] [address]  nephrolithiasis  HPI: Stacey Wilson is a 42yo here for followup for nephrolithiasis. She has had 2 stone events in the past month. She drinks 64oz of water daily. She stopped drinking sodas. No worsening LUTS. No other complaints today. No recent imaging.    PMH: Past Medical History:  Diagnosis Date   Anxiety    History of kidney stones    Renal calculi    bilateral   Urgency of urination    Warts, genital     Surgical History: Past Surgical History:  Procedure Laterality Date   BIOPSY  01/10/2020   Procedure: BIOPSY;  Surgeon: Dolores Frame, MD;  [facility];  Service: Gastroenterology;;   BREAST SURGERY Right 2002   benign cyst   COLONOSCOPY WITH PROPOFOL N/A 01/10/2020   Procedure: COLONOSCOPY WITH PROPOFOL;  Surgeon: Dolores Frame, MD;  [facility];  Service: Gastroenterology;  Laterality: N/A;  730   CYSTOSCOPY W/ URETERAL STENT PLACEMENT Bilateral 05/19/2016   Procedure: CYSTOSCOPY WITH RETROGRADE PYELOGRAM/URETEROSCOPY/STONE EXTRACTION WITH BASKET / STENT PLACEMENT;  Surgeon: Malen Gauze, MD;  Location: [facility];  Service: Urology;  Laterality: Bilateral;   CYSTOSCOPY WITH RETROGRADE PYELOGRAM, URETEROSCOPY AND STENT PLACEMENT Bilateral 07/25/2015   Procedure: CYSTOSCOPY WITH BILATERAL RETROGRADE PYELOGRAM, BILATERAL URETEROSCOPY AND BILATERAL STENT PLACEMENT;  Surgeon: Malen Gauze, MD;  Location: [facility];  Service: Urology;  Laterality: Bilateral;   CYSTOSCOPY WITH RETROGRADE PYELOGRAM, URETEROSCOPY AND STENT PLACEMENT Left 11/07/2020   Procedure: CYSTOSCOPY WITH RETROGRADE PYELOGRAM, URETEROSCOPY AND STENT PLACEMENT;  Surgeon: Malen Gauze, MD;  Location: [facility];  Service: Urology;  Laterality: Left;   STONE EXTRACTION WITH BASKET Bilateral  07/25/2015   Procedure: BILATERAL RENAL STONE EXTRACTION WITH BASKET;  Surgeon: Malen Gauze, MD;  Location: [facility];  Service: Urology;  Laterality: Bilateral;    Home Medications:  Allergies as of 09/02/2022       Reactions   Nystatin Itching        Medication List        Accurate as of Sep 02, 2022  2:49 PM. If you have any questions, ask your nurse or doctor.          Incassia 0.35 MG tablet Generic drug: norethindrone TAKE 1 TABLET BY MOUTH EVERY DAY   loratadine 10 MG tablet Commonly known as: CLARITIN Take 10 mg by mouth daily.   pantoprazole 40 MG tablet Commonly known as: PROTONIX Take 1 tablet (40 mg total) by mouth daily.   promethazine 12.5 MG tablet Commonly known as: PHENERGAN Take 12.5 mg by mouth every 6 (six) hours as needed for nausea or vomiting.   Vitamin C 500 MG Caps Take 500 mg by mouth daily.        Allergies:  Allergies  Allergen Reactions   Nystatin Itching    Family History: No family history on file.  Social History:  reports that she has been smoking cigarettes. She has a 7.50 pack-year smoking history. She has never used smokeless tobacco. She reports current alcohol use of about 1.0 standard drink of alcohol per week. She reports that she does not use drugs.  ROS: All other review of systems were reviewed and are negative except what is noted above in HPI  Physical Exam: BP 108/74   Pulse 83   Constitutional:  Alert and oriented, No acute distress. HEENT: [place] AT, moist mucus membranes.  Trachea midline, no masses. Cardiovascular: No clubbing, cyanosis, or edema. Respiratory: Normal respiratory effort, no increased work of breathing. GI: Abdomen is soft, nontender, nondistended, no abdominal masses GU: No CVA tenderness.  Lymph: No cervical or inguinal lymphadenopathy. Skin: No rashes, bruises or suspicious lesions. Neurologic: Grossly intact, no focal deficits, moving all 4 extremities. Psychiatric: Normal mood and  affect.  Laboratory Data: Lab Results  Component Value Date   WBC 6.6 04/14/2022   HGB 13.2 04/14/2022   HCT 38.8 04/14/2022   MCV 88.4 04/14/2022   PLT 232 04/14/2022    Lab Results  Component Value Date   CREATININE 0.61 04/14/2022    No results found for: "PSA"  No results found for: "TESTOSTERONE"  No results found for: "HGBA1C"  Urinalysis    Component Value Date/Time   COLORURINE YELLOW 01/16/2021 1445   APPEARANCEUR HAZY (A) 01/16/2021 1445   APPEARANCEUR Clear 10/10/2020 1046   LABSPEC >1.030 (H) 01/16/2021 1445   PHURINE 5.5 01/16/2021 1445   GLUCOSEU NEGATIVE 01/16/2021 1445   HGBUR MODERATE (A) 01/16/2021 1445   BILIRUBINUR NEGATIVE 01/16/2021 1445   BILIRUBINUR Negative 10/10/2020 1046   KETONESUR NEGATIVE 01/16/2021 1445   PROTEINUR TRACE (A) 01/16/2021 1445   NITRITE NEGATIVE 01/16/2021 1445   LEUKOCYTESUR NEGATIVE 01/16/2021 1445    Lab Results  Component Value Date   LABMICR See below: 10/10/2020   WBCUA 0-5 10/10/2020   LABEPIT 0-10 10/10/2020   MUCUS Present 10/10/2020   BACTERIA MANY (A) 01/16/2021    Pertinent Imaging:  No results found for this or any previous visit.  No results found for this or any previous visit.  No results found for this or any previous visit.  No results found for this or any previous visit.  Results for orders placed during the hospital encounter of 07/04/16  US Renal  Narrative CLINICAL DATA:  Follow-up kidney stone  EXAM: RENAL / URINARY TRACT ULTRASOUND COMPLETE  COMPARISON:  02/11/2016, CT 07/04/2015  FINDINGS: Right Kidney:  Length: 11.1 cm. Echogenicity within normal limits. No mass or hydronephrosis visualized. Probable shadowing stone in the midpole measuring 5 mm  Left Kidney:  Length: 11.9 cm. Echogenicity within normal limits. Mild fullness of the lower left renal pelvis. No definitive shadowing stones are visualized.  Bladder:  Appears normal for degree of bladder  distention. Prevoid bladder volume 136 mL. Postvoid bladder volume 21 mL.  IMPRESSION: 1. Mild enlargement of the left lower renal pelvis without frank hydronephrosis. Previously noted multiple shadowing stones are not clearly visualized. 2. Probable 5 mm shadowing stone in the mid right kidney. No hydronephrosis.   Electronically Signed By: Jasmine Pang M.D. On: 07/04/2016 17:24  No valid procedures specified. No results found for this or any previous visit.  Results for orders placed in visit on 10/10/20  CT RENAL STONE STUDY  Narrative CLINICAL DATA:  Flank pain  EXAM: CT ABDOMEN AND PELVIS WITHOUT CONTRAST  TECHNIQUE: Multidetector CT imaging of the abdomen and pelvis was performed following the standard protocol without IV contrast.  COMPARISON:  07/04/2015  FINDINGS: Lower chest: No acute abnormality.  Hepatobiliary: No focal liver abnormality is seen. No gallstones, gallbladder wall thickening, or biliary dilatation.  Pancreas: Unremarkable. No pancreatic ductal dilatation or surrounding inflammatory changes.  Spleen: Normal in size without focal abnormality.  Adrenals/Urinary Tract: Normal adrenal glands.  Bilateral, upper and lower pole renal calculi identified. The largest cluster of stones is in  the inferior pole of the right kidney measuring 1 cm, image 69/5. Similar to previous exam. No mass or hydronephrosis identified bilaterally. No hydroureter or ureteral calculi identified. Calcified phleboliths are identified within both sides of pelvis. The urinary bladder is unremarkable.  Stomach/Bowel: Stomach is within normal limits. Appendix appears normal. No evidence of bowel wall thickening, distention, or inflammatory changes.  Vascular/Lymphatic: No significant vascular findings are present. No enlarged abdominal or pelvic lymph nodes.  Reproductive: Uterus and bilateral adnexa are unremarkable.  Other: No free fluid or fluid  collections.  Musculoskeletal: No acute or significant osseous findings.  IMPRESSION: 1. No acute findings within the abdomen or pelvis. The largest cluster of stones is in the inferior pole collecting system of the right kidney measuring 1 cm. Similar to previous exam. 2. Bilateral nonobstructing renal calculi. 3. Normal appendix.   Electronically Signed By: Signa Kell M.D. On: 10/10/2020 13:11   Assessment & Plan:    1. Kidney stones -renal US today  - Urinalysis, Routine w reflex microscopic   No follow-ups on file.  Wilkie Aye, MD  [facility] [place]

## 2022-09-09 ENCOUNTER — Encounter: Payer: Self-pay | Admitting: Urology

## 2022-09-09 NOTE — Patient Instructions (Signed)

## 2022-09-15 ENCOUNTER — Telehealth: Payer: Self-pay

## 2022-09-15 NOTE — Telephone Encounter (Signed)
Patient is calling to check on the status of her FMLA paerwork due Tuesday, 09-16-2022.  Call:  [phone]

## 2022-09-15 NOTE — Telephone Encounter (Signed)
Patient aware MD will sign tomorrow and it will be faxed by 05/21

## 2022-09-15 NOTE — Telephone Encounter (Signed)
Please see below.

## 2022-09-16 NOTE — Telephone Encounter (Signed)
My chart message sent to patient FMLA paperwork ready to be picked up and placed at front desk

## 2022-11-05 ENCOUNTER — Encounter: Payer: Self-pay | Admitting: Family Medicine

## 2022-11-05 ENCOUNTER — Other Ambulatory Visit: Payer: Self-pay | Admitting: Family Medicine

## 2022-11-06 ENCOUNTER — Other Ambulatory Visit: Payer: Self-pay | Admitting: Family Medicine

## 2022-11-06 MED ORDER — CLONAZEPAM 0.5 MG PO TABS: ORAL_TABLET | ORAL | 0 refills | Status: AC

## 2022-11-29 ENCOUNTER — Other Ambulatory Visit: Payer: Self-pay | Admitting: Adult Health

## 2022-12-15 ENCOUNTER — Telehealth: Payer: Self-pay

## 2022-12-15 NOTE — Telephone Encounter (Signed)
Waiting for MD to confirm imaging is needed prior to apt.

## 2022-12-15 NOTE — Telephone Encounter (Signed)
Needing an renal ultra sound order before visit.  Please advise.

## 2022-12-16 ENCOUNTER — Encounter: Payer: Self-pay | Admitting: Urology

## 2022-12-17 ENCOUNTER — Ambulatory Visit (INDEPENDENT_AMBULATORY_CARE_PROVIDER_SITE_OTHER): Payer: Managed Care, Other (non HMO) | Admitting: Urology

## 2022-12-17 VITALS — BP 130/81 | HR 76

## 2022-12-17 DIAGNOSIS — R350 Frequency of micturition: Secondary | ICD-10-CM | POA: Diagnosis not present

## 2022-12-17 DIAGNOSIS — R1012 Left upper quadrant pain: Secondary | ICD-10-CM | POA: Diagnosis not present

## 2022-12-17 DIAGNOSIS — N200 Calculus of kidney: Secondary | ICD-10-CM | POA: Diagnosis not present

## 2022-12-17 MED ORDER — GEMTESA 75 MG PO TABS: 1.0000 | ORAL_TABLET | Freq: Every day | ORAL | Status: DC

## 2022-12-17 NOTE — Progress Notes (Signed)
12/17/2022 3:15 PM   Stacey Wilson 01/06/1981 [id number]  Referring provider: Babs Sciara, MD [address] [place],  [place] [address]  Followup nephrolithiasis   HPI: [name] is a 42yo here for followup for nephrolithiasis and here with new onset urinary urgency and frequency. She denies any stone events since last visit.She has intermittent bilateral flank pain. For the past several weeks she has noted worsening urinary urgency and frequency. No nocturia.    PMH: Past Medical History:  Diagnosis Date   Anxiety    History of kidney stones    Renal calculi    bilateral   Urgency of urination    Warts, genital     Surgical History: Past Surgical History:  Procedure Laterality Date   BIOPSY  01/10/2020   Procedure: BIOPSY;  Surgeon: Dolores Frame, MD;  [facility];  Service: Gastroenterology;;   BREAST SURGERY Right 2002   benign cyst   COLONOSCOPY WITH PROPOFOL N/A 01/10/2020   Procedure: COLONOSCOPY WITH PROPOFOL;  Surgeon: Dolores Frame, MD;  [facility];  Service: Gastroenterology;  Laterality: N/A;  730   CYSTOSCOPY W/ URETERAL STENT PLACEMENT Bilateral 05/19/2016   Procedure: CYSTOSCOPY WITH RETROGRADE PYELOGRAM/URETEROSCOPY/STONE EXTRACTION WITH BASKET / STENT PLACEMENT;  Surgeon: Malen Gauze, MD;  Location: [facility];  Service: Urology;  Laterality: Bilateral;   CYSTOSCOPY WITH RETROGRADE PYELOGRAM, URETEROSCOPY AND STENT PLACEMENT Bilateral 07/25/2015   Procedure: CYSTOSCOPY WITH BILATERAL RETROGRADE PYELOGRAM, BILATERAL URETEROSCOPY AND BILATERAL STENT PLACEMENT;  Surgeon: Malen Gauze, MD;  Location[facility];  Service: Urology;  Laterality: Bilateral;   CYSTOSCOPY WITH RETROGRADE PYELOGRAM, URETEROSCOPY AND STENT PLACEMENT Left 11/07/2020   Procedure: CYSTOSCOPY WITH RETROGRADE PYELOGRAM, URETEROSCOPY AND STENT PLACEMENT;  Surgeon: Malen Gauze, MD;  Location: [facility];   Service: Urology;  Laterality: Left;   STONE EXTRACTION WITH BASKET Bilateral 07/25/2015   Procedure: BILATERAL RENAL STONE EXTRACTION WITH BASKET;  Surgeon: Malen Gauze, MD;  Location: [facility];  Service: Urology;  Laterality: Bilateral;    Home Medications:  Allergies as of 12/17/2022       Reactions   Nystatin Itching        Medication List        Accurate as of December 17, 2022  3:15 PM. If you have any questions, ask your nurse or doctor.          clonazePAM 0.5 MG tablet Commonly known as: KLONOPIN Take 1/2-1 tab po BID prn anxiety   fluticasone 50 MCG/ACT nasal spray Commonly known as: FLONASE Place into both nostrils daily.   Incassia 0.35 MG tablet Generic drug: norethindrone TAKE 1 TABLET BY MOUTH EVERY DAY   loratadine 10 MG tablet Commonly known as: CLARITIN Take 10 mg by mouth daily.   omeprazole 20 MG capsule Commonly known as: PRILOSEC Take 20 mg by mouth daily.   pantoprazole 40 MG tablet Commonly known as: PROTONIX Take 1 tablet (40 mg total) by mouth daily.   promethazine 12.5 MG tablet Commonly known as: PHENERGAN Take 12.5 mg by mouth every 6 (six) hours as needed for nausea or vomiting.   Vitamin C 500 MG Caps Take 500 mg by mouth daily.        Allergies:  Allergies  Allergen Reactions   Nystatin Itching    Family History: No family history on file.  Social History:  reports that she has been smoking cigarettes. She has a 7.5 pack-year smoking history. She has  never used smokeless tobacco. She reports current alcohol use of about 1.0 standard drink of alcohol per week. She reports that she does not use drugs.  ROS: All other review of systems were reviewed and are negative except what is noted above in HPI  Physical Exam: BP 130/81   Pulse 76   Constitutional:  Alert and oriented, No acute distress. HEENT: [place] AT, moist mucus membranes.  Trachea midline, no masses. Cardiovascular: No clubbing, cyanosis, or  edema. Respiratory: Normal respiratory effort, no increased work of breathing. GI: Abdomen is soft, nontender, nondistended, no abdominal masses GU: No CVA tenderness.  Lymph: No cervical or inguinal lymphadenopathy. Skin: No rashes, bruises or suspicious lesions. Neurologic: Grossly intact, no focal deficits, moving all 4 extremities. Psychiatric: Normal mood and affect.  Laboratory Data: Lab Results  Component Value Date   WBC 6.6 04/14/2022   HGB 13.2 04/14/2022   HCT 38.8 04/14/2022   MCV 88.4 04/14/2022   PLT 232 04/14/2022    Lab Results  Component Value Date   CREATININE 0.61 04/14/2022    No results found for: "PSA"  No results found for: "TESTOSTERONE"  No results found for: "HGBA1C"  Urinalysis    Component Value Date/Time   COLORURINE YELLOW 01/16/2021 1445   APPEARANCEUR HAZY (A) 01/16/2021 1445   APPEARANCEUR Clear 10/10/2020 1046   LABSPEC >1.030 (H) 01/16/2021 1445   PHURINE 5.5 01/16/2021 1445   GLUCOSEU NEGATIVE 01/16/2021 1445   HGBUR MODERATE (A) 01/16/2021 1445   BILIRUBINUR NEGATIVE 01/16/2021 1445   BILIRUBINUR Negative 10/10/2020 1046   KETONESUR NEGATIVE 01/16/2021 1445   PROTEINUR TRACE (A) 01/16/2021 1445   NITRITE NEGATIVE 01/16/2021 1445   LEUKOCYTESUR NEGATIVE 01/16/2021 1445    Lab Results  Component Value Date   LABMICR See below: 10/10/2020   WBCUA 0-5 10/10/2020   LABEPIT 0-10 10/10/2020   MUCUS Present 10/10/2020   BACTERIA MANY (A) 01/16/2021    Pertinent Imaging:  No results found for this or any previous visit.  No results found for this or any previous visit.  No results found for this or any previous visit.  No results found for this or any previous visit.  Results for orders placed in visit on 09/02/22  Ultrasound renal complete  Narrative CLINICAL DATA:  Nephrolithiasis follow-up  EXAM: RENAL / URINARY TRACT ULTRASOUND COMPLETE  COMPARISON:  CT abdomen and pelvis dated January 16, 2021  FINDINGS: Right Kidney:  Renal measurements: 11.8 x 5.6 x 6.8 cm = volume: 132.2 mL. Echogenicity within normal limits. No mass or hydronephrosis visualized. Multiple calculi, largest is located in the upper pole and measures 7 mm.  Left Kidney:  Renal measurements: 12.1 x 6.8 x 5.6 cm = volume: 237.9 mL. Echogenicity within normal limits. No mass or hydronephrosis visualized. Lower pole calculus measuring 10 mm.  Bladder:  Appears normal for degree of bladder distention. Bladder jets visualized bilaterally.  Other:  None.  IMPRESSION: 1. Bilateral nephrolithiasis. 2. No hydronephrosis.   Electronically Signed By: Allegra Lai M.D. On: 09/02/2022 15:41  No valid procedures specified. No results found for this or any previous visit.  Results for orders placed in visit on 10/10/20  CT RENAL STONE STUDY  Narrative CLINICAL DATA:  Flank pain  EXAM: CT ABDOMEN AND PELVIS WITHOUT CONTRAST  TECHNIQUE: Multidetector CT imaging of the abdomen and pelvis was performed following the standard protocol without IV contrast.  COMPARISON:  07/04/2015  FINDINGS: Lower chest: No acute abnormality.  Hepatobiliary: No focal liver abnormality is  seen. No gallstones, gallbladder wall thickening, or biliary dilatation.  Pancreas: Unremarkable. No pancreatic ductal dilatation or surrounding inflammatory changes.  Spleen: Normal in size without focal abnormality.  Adrenals/Urinary Tract: Normal adrenal glands.  Bilateral, upper and lower pole renal calculi identified. The largest cluster of stones is in the inferior pole of the right kidney measuring 1 cm, image 69/5. Similar to previous exam. No mass or hydronephrosis identified bilaterally. No hydroureter or ureteral calculi identified. Calcified phleboliths are identified within both sides of pelvis. The urinary bladder is unremarkable.  Stomach/Bowel: Stomach is within normal limits. Appendix  appears normal. No evidence of bowel wall thickening, distention, or inflammatory changes.  Vascular/Lymphatic: No significant vascular findings are present. No enlarged abdominal or pelvic lymph nodes.  Reproductive: Uterus and bilateral adnexa are unremarkable.  Other: No free fluid or fluid collections.  Musculoskeletal: No acute or significant osseous findings.  IMPRESSION: 1. No acute findings within the abdomen or pelvis. The largest cluster of stones is in the inferior pole collecting system of the right kidney measuring 1 cm. Similar to previous exam. 2. Bilateral nonobstructing renal calculi. 3. Normal appendix.   Electronically Signed By: Signa Kell M.D. On: 10/10/2020 13:11   Assessment & Plan:    1. Kidney stones -CT stone study, will call with results  2. Urinary frequency -we will trial gemtesa 75mg  daily   No follow-ups on file.  Wilkie Aye, MD  [facility] [place]

## 2022-12-18 ENCOUNTER — Ambulatory Visit (HOSPITAL_COMMUNITY)
Admission: RE | Admit: 2022-12-18 | Discharge: 2022-12-18 | Disposition: A | Discharge status: Home / Self Care | Payer: Managed Care, Other (non HMO) | Source: Ambulatory Visit | Attending: Urology | Admitting: Urology

## 2022-12-18 ENCOUNTER — Encounter (HOSPITAL_COMMUNITY): Payer: Self-pay | Admitting: Radiology

## 2022-12-18 DIAGNOSIS — R1012 Left upper quadrant pain: Secondary | ICD-10-CM | POA: Diagnosis present

## 2022-12-23 ENCOUNTER — Encounter: Payer: Self-pay | Admitting: Urology

## 2022-12-23 NOTE — Patient Instructions (Signed)

## 2022-12-24 ENCOUNTER — Other Ambulatory Visit: Payer: Self-pay | Admitting: Adult Health

## 2023-01-15 ENCOUNTER — Other Ambulatory Visit (HOSPITAL_COMMUNITY)
Admission: RE | Admit: 2023-01-15 | Discharge: 2023-01-15 | Disposition: A | Discharge status: Home / Self Care | Payer: Managed Care, Other (non HMO) | Source: Ambulatory Visit | Attending: Adult Health | Admitting: Adult Health

## 2023-01-15 ENCOUNTER — Ambulatory Visit: Payer: Managed Care, Other (non HMO) | Admitting: Adult Health

## 2023-01-15 ENCOUNTER — Encounter: Payer: Self-pay | Admitting: Adult Health

## 2023-01-15 ENCOUNTER — Encounter (HOSPITAL_COMMUNITY): Payer: Self-pay

## 2023-01-15 ENCOUNTER — Ambulatory Visit (HOSPITAL_COMMUNITY)
Admission: RE | Admit: 2023-01-15 | Discharge: 2023-01-15 | Disposition: A | Discharge status: Home / Self Care | Payer: Managed Care, Other (non HMO) | Source: Ambulatory Visit | Attending: Adult Health | Admitting: Adult Health

## 2023-01-15 VITALS — BP 137/91 | HR 78 | Ht 65.0 in | Wt 202.0 lb

## 2023-01-15 DIAGNOSIS — Z3041 Encounter for surveillance of contraceptive pills: Secondary | ICD-10-CM

## 2023-01-15 DIAGNOSIS — F17200 Nicotine dependence, unspecified, uncomplicated: Secondary | ICD-10-CM | POA: Diagnosis not present

## 2023-01-15 DIAGNOSIS — Z01419 Encounter for gynecological examination (general) (routine) without abnormal findings: Secondary | ICD-10-CM | POA: Insufficient documentation

## 2023-01-15 DIAGNOSIS — R030 Elevated blood-pressure reading, without diagnosis of hypertension: Secondary | ICD-10-CM | POA: Insufficient documentation

## 2023-01-15 DIAGNOSIS — Z1231 Encounter for screening mammogram for malignant neoplasm of breast: Secondary | ICD-10-CM | POA: Insufficient documentation

## 2023-01-15 MED ORDER — NORETHINDRONE 0.35 MG PO TABS: 1.0000 | ORAL_TABLET | Freq: Every day | ORAL | 3 refills | Status: DC

## 2023-01-15 NOTE — Progress Notes (Signed)
Patient ID: Stacey Wilson, female   DOB: 10-11-1980, 42 y.o.   MRN: [id number] History of Present Illness: Stacey Wilson is a 42 year old white female,married, G1P1001 in for a well woman gyn exam and pap.   PCP is Dr Lilyan Punt   Current Medications, Allergies, Past Medical History, Past Surgical History, Family History and Social History were reviewed in [facility] electronic medical record.     Review of Systems: Patient denies any headaches, hearing loss, fatigue, blurred vision, shortness of breath, chest pain, abdominal pain, problems with bowel movements, urination, or intercourse. No joint pain or mood swings.     Physical Exam:BP (!) 137/91 (BP Location: Right Arm, Patient Position: Sitting, Cuff Size: Normal)   Pulse 78   Ht 5\' 5"  (1.651 m)   Wt 202 lb (91.6 kg)   LMP 01/12/2023   BMI 33.61 kg/m   General:  Well developed, well nourished, no acute distress Skin:  Warm and dry Neck:  Midline trachea, normal thyroid, good ROM, no lymphadenopathy Lungs; Clear to auscultation bilaterally Breast:  No dominant palpable mass, retraction, or nipple discharge Cardiovascular: Regular rate and rhythm Abdomen:  Soft, non tender, no hepatosplenomegaly Pelvic:  External genitalia is normal in appearance, no lesions.  The vagina is normal in appearance,+light blood. Urethra has no lesions or masses. The cervix is smooth, pap with HR HPV genotyping performed.  Uterus is felt to be normal size, shape, and contour.  No adnexal masses or tenderness noted.Bladder is non tender, no masses felt. Rectal: Deferred Extremities/musculoskeletal:  No swelling or varicosities noted, no clubbing or cyanosis Psych:  No mood changes, alert and cooperative,seems happy AA is 1 Fall risk is low    01/15/2023    9:55 AM 04/14/2022   11:49 AM 02/13/2021    8:38 AM  Depression screen PHQ 2/9  Decreased Interest 0 0 0  Down, Depressed, Hopeless 0 0 0  PHQ - 2 Score 0 0 0  Altered sleeping 1 1   Tired,  decreased energy 1 1   Change in appetite 1 1   Feeling bad or failure about yourself  0 0   Trouble concentrating 0 0   Moving slowly or fidgety/restless 0 0   Suicidal thoughts 0 0   PHQ-9 Score 3 3   Difficult doing work/chores  Not difficult at all        01/15/2023    9:55 AM 04/14/2022   11:49 AM 10/13/2019    9:52 AM 04/26/2018    9:21 AM  GAD 7 : Generalized Anxiety Score  Nervous, Anxious, on Edge 0 0 2 1  Control/stop worrying 0 0 1 1  Worry too much - different things 0 0 1 1  Trouble relaxing 0 0 1   Restless 0 0 0 1  Easily annoyed or irritable 0 0 1 1  Afraid - awful might happen 0 0 1 0  Total GAD 7 Score 0 0 7   Anxiety Difficulty  Not difficult at all Not difficult at all       Upstream - 01/15/23 0959       Pregnancy Intention Screening   Does the patient want to become pregnant in the next year? No    Does the patient's partner want to become pregnant in the next year? No    Would the patient like to discuss contraceptive options today? No      Contraception Wrap Up   Current Method Oral Contraceptive    End  Method Oral Contraceptive    Contraception Counseling Provided Yes            Examination chaperoned by Malachy Mood LPN  Impression and Plan: 1. Encounter for gynecological examination with Papanicolaou smear of cervix Pap sent Pap in 3 years if normal Physical in 1 year Labs with PCP - Cytology - PAP( [place])  2. Encounter for surveillance of contraceptive pills Refill Incassia Meds ordered this encounter  Medications   norethindrone (INCASSIA) 0.35 MG tablet    Sig: Take 1 tablet (0.35 mg total) by mouth daily.    Dispense:  84 tablet    Refill:  3    Order Specific Question:   Supervising Provider    Answer:   Despina Hidden, LUTHER H [[id number]]     3. Smoker  4. Screening mammogram for breast cancer Mammogram scheduled for her today  - MM 3D SCREENING MAMMOGRAM BILATERAL BREAST; Future  5. Elevated BP without diagnosis of  hypertension Had worked 12 hours last night, just keep check on BP

## 2023-01-20 LAB — CYTOLOGY - PAP
Chlamydia: NEGATIVE
Comment: NEGATIVE
Comment: NEGATIVE
Comment: NORMAL
Diagnosis: NEGATIVE
High risk HPV: NEGATIVE
Neisseria Gonorrhea: NEGATIVE

## 2023-06-07 IMAGING — CT CT ABD-PELV W/ CM
2 of 4 series · 16 of 46 positions shown, 18 images · IV contrast (omnipaque)
Comparison: 10/10/2020

CLINICAL DATA: Left upper quadrant pain no gross left upper
quadrant and lower abdominal pain

EXAM:
CT ABDOMEN AND PELVIS WITH CONTRAST
TECHNIQUE: Multidetector CT imaging of the abdomen and pelvis was performed
using the standard protocol following bolus administration of
intravenous contrast.
CONTRAST:  80mL OMNIPAQUE IOHEXOL 350 MG/ML SOLN

[Series 2: axial st · axial · 0.94mm/px · z∈[+878,+1333]mm · 13 of 101 slices shown, 15 images]
[im 5/101  soft-tissue]
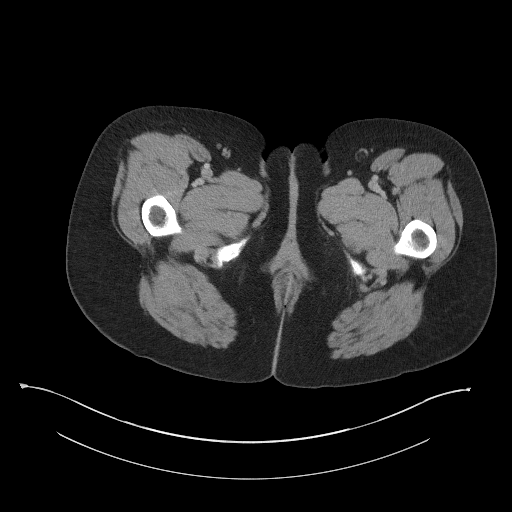
[im 5/101  bone]
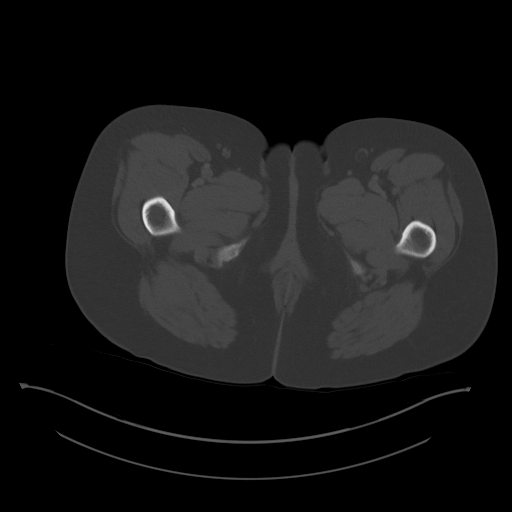
[im 13/101  soft-tissue]
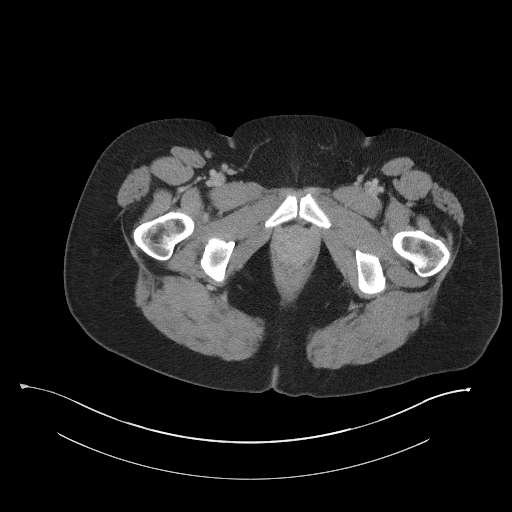
[im 21/101  soft-tissue]
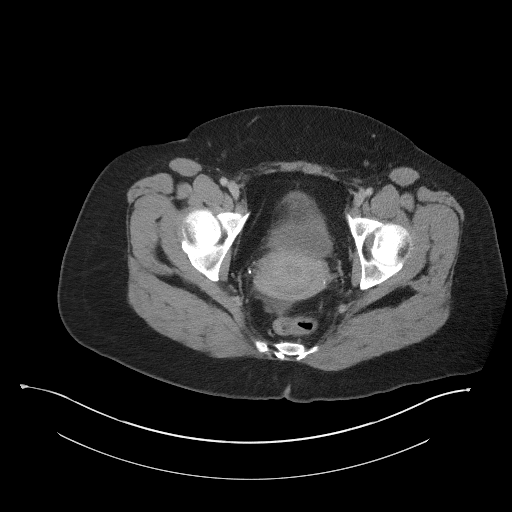
[im 30/101  soft-tissue]
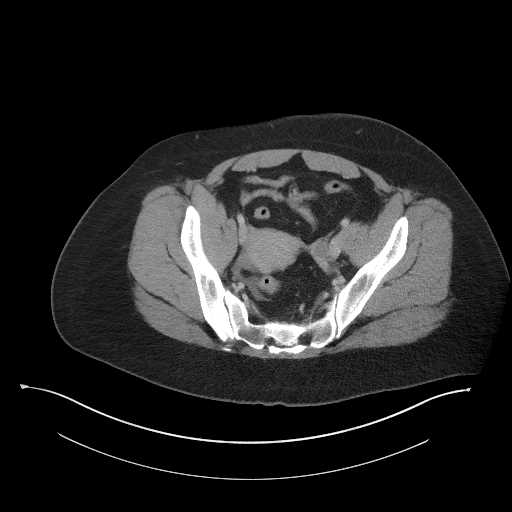
[im 34/101  soft-tissue]
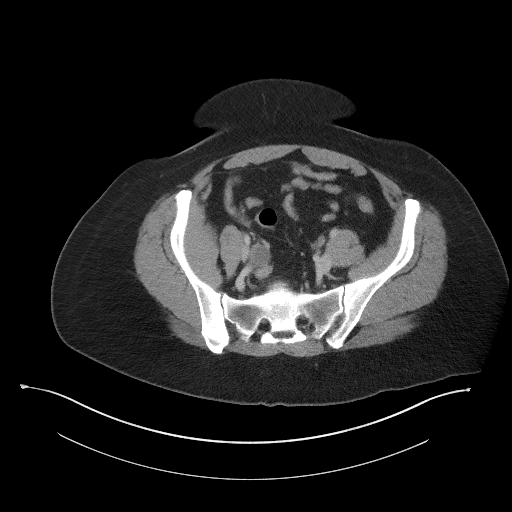
[im 42/101  soft-tissue]
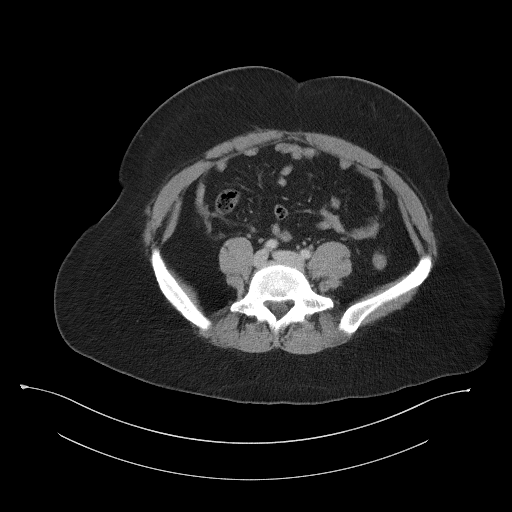
[im 51/101  soft-tissue]
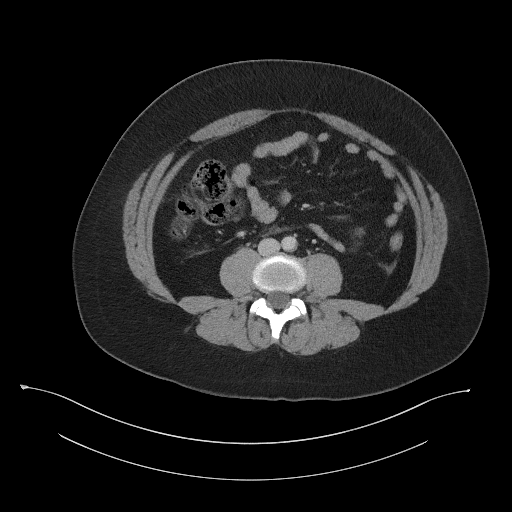
[im 59/101  soft-tissue]
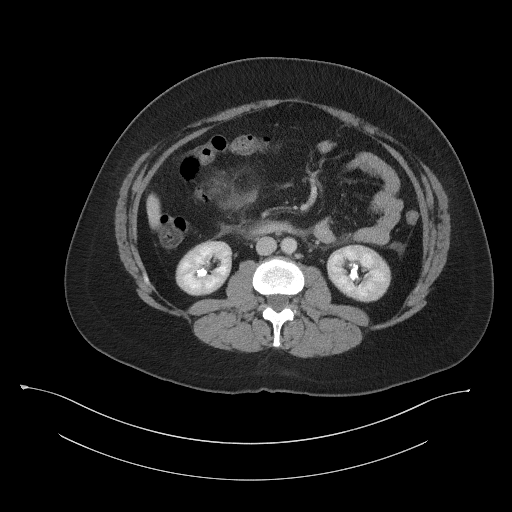
[im 67/101  soft-tissue]
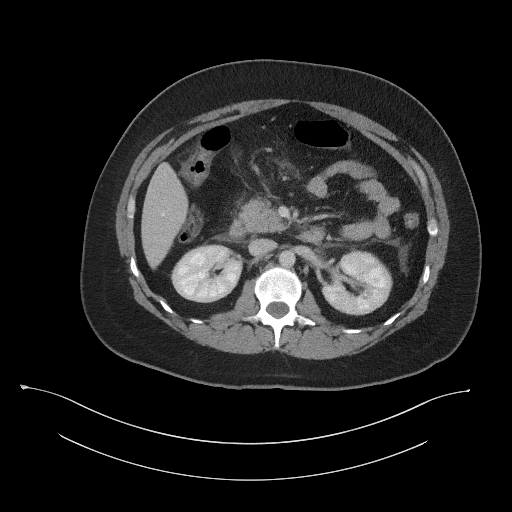
[im 67/101  bone]
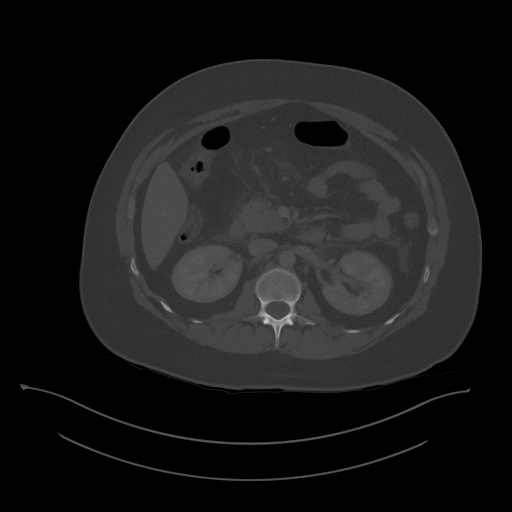
[im 71/101  soft-tissue]
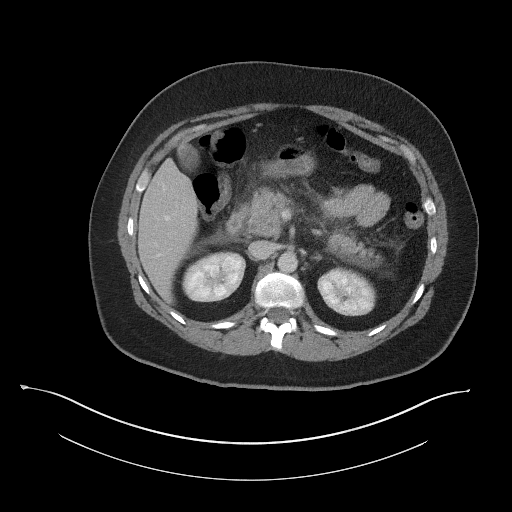
[im 80/101  soft-tissue]
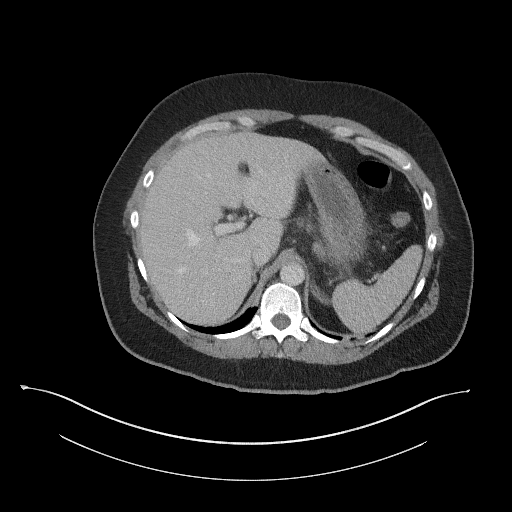
[im 88/101  soft-tissue]
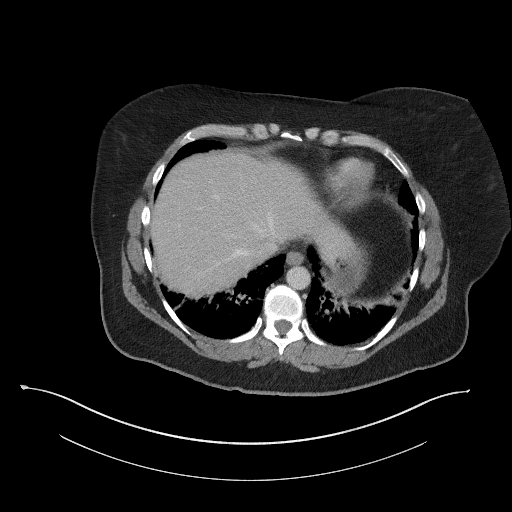
[im 96/101  soft-tissue]
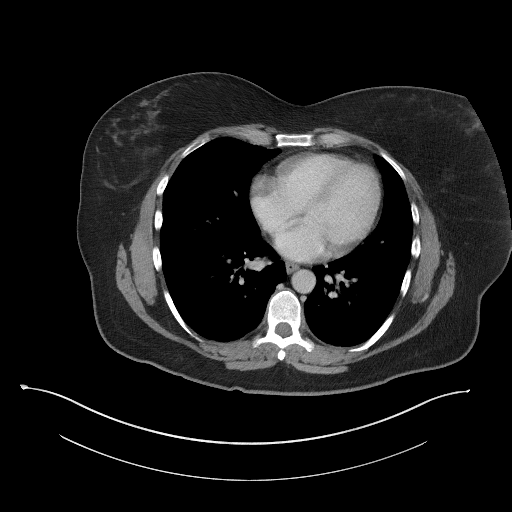

[Series 5: coronal st · coronal · 0.87mm/px · 3 of 117 slices shown]
[im 39/117  soft-tissue]
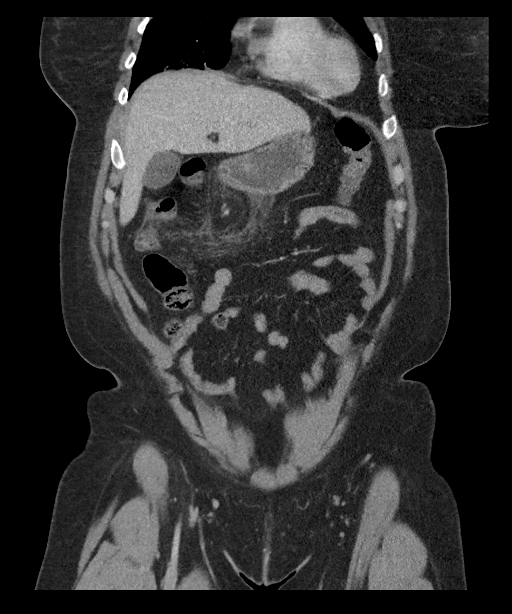
[im 52/117  soft-tissue]
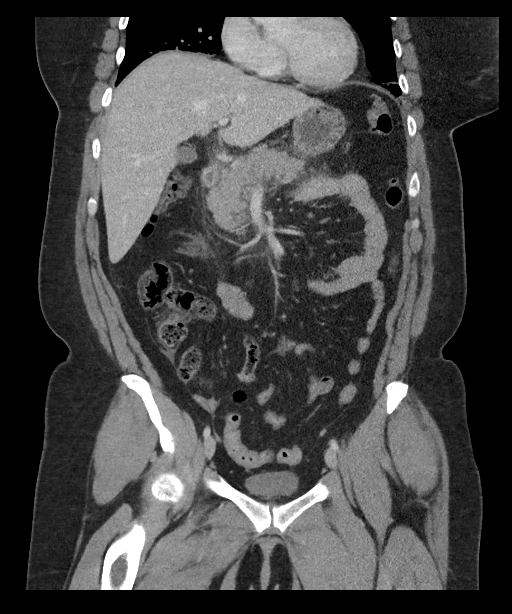
[im 65/117  soft-tissue]
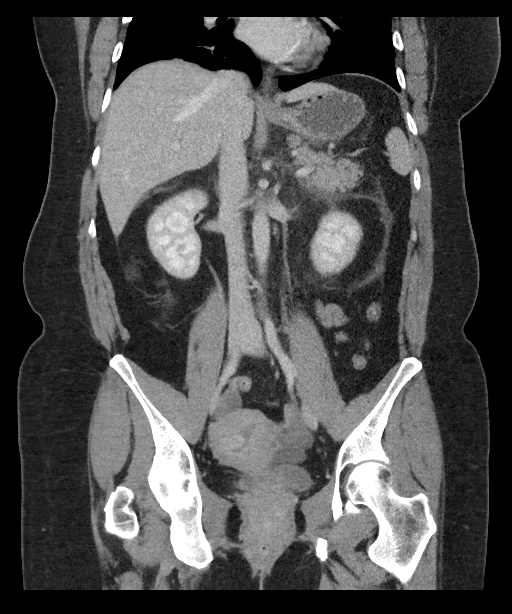

[16 of 46 positions shown; findings below may reference images not displayed]

FINDINGS: Lower chest: Increased bibasilar atelectasis. Normal heart size. No
pericardial or pleural effusion.

Hepatobiliary: No focal liver abnormality is seen. No gallstones,
gallbladder wall thickening, or biliary dilatation.

Pancreas: Diffuse mild peripancreatic strandy edema/fluid extending
into the right abdominal mesentery and along the duodenum compatible
with acute pancreatitis. Pancreas continues to enhance normally. No
fluid collection or ductal dilatation. No vascular complication.

Spleen: Normal in size without focal abnormality.

Adrenals/Urinary Tract: Normal adrenal glands. Similar bilateral
nonobstructing nephrolithiasis most pronounced in the lower poles.
No hydronephrosis or hydroureter. Ureters are symmetric and
decompressed. Bladder collapsed.

Stomach/Bowel: Stomach is within normal limits. Appendix appears
normal. No evidence of bowel wall thickening, distention, or
inflammatory changes.

Vascular/Lymphatic: Intact aorta. Negative for aneurysm or
dissection. Mesenteric and renal vasculature all appear patent. No
Mouzayan process. No bulky adenopathy.

Reproductive: Uterus and adnexa normal in size. Small amount of
pelvic free fluid.

Other: No abdominal wall hernia or abnormality. No abdominopelvic
ascites.

Musculoskeletal: No acute or significant osseous findings.
IMPRESSION: Mild acute pancreatitis without other complicating feature.

Bibasilar atelectasis

Nonobstructing bilateral nephrolithiasis

## 2023-06-08 IMAGING — US US ABDOMEN LIMITED
1 series · 14 of 25 positions shown · non-contrast
Comparison: CT abdomen and pelvis 01/16/2021

CLINICAL DATA: RIGHT upper quadrant pain for 3 days, pancreatitis

EXAM:
ULTRASOUND ABDOMEN LIMITED RIGHT UPPER QUADRANT

[Series 1: us abdomen limited ruq (liver/gb) · 14 of 51 slices shown]
[im 1/51]
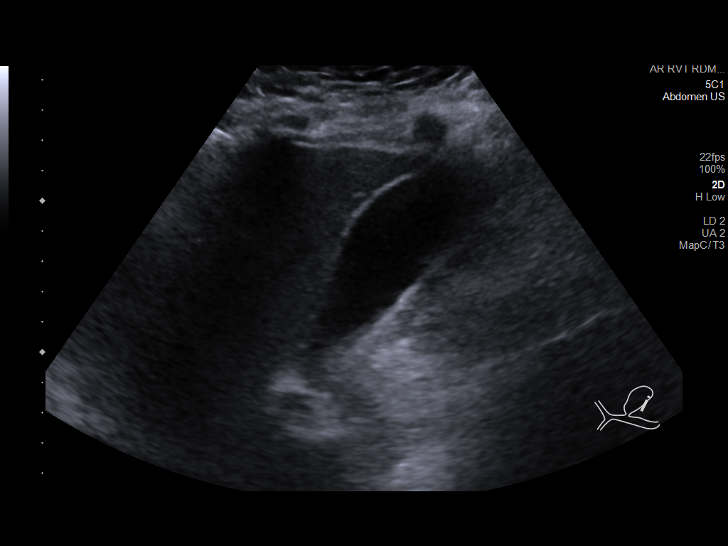
[im 5/51]
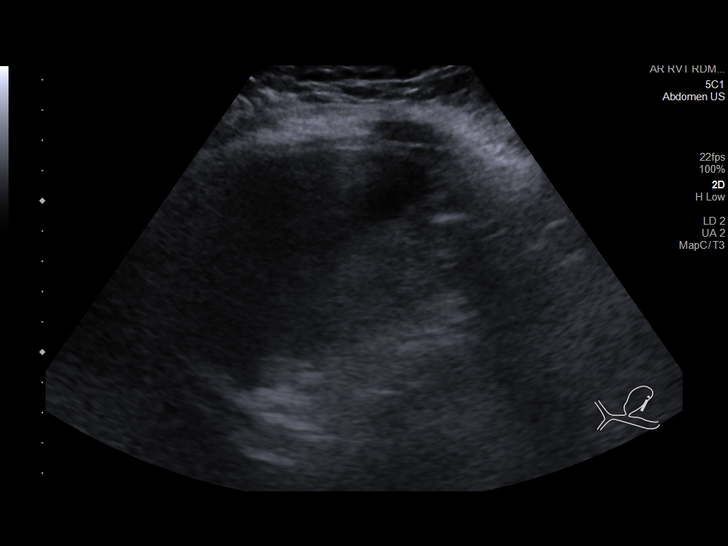
[im 9/51]
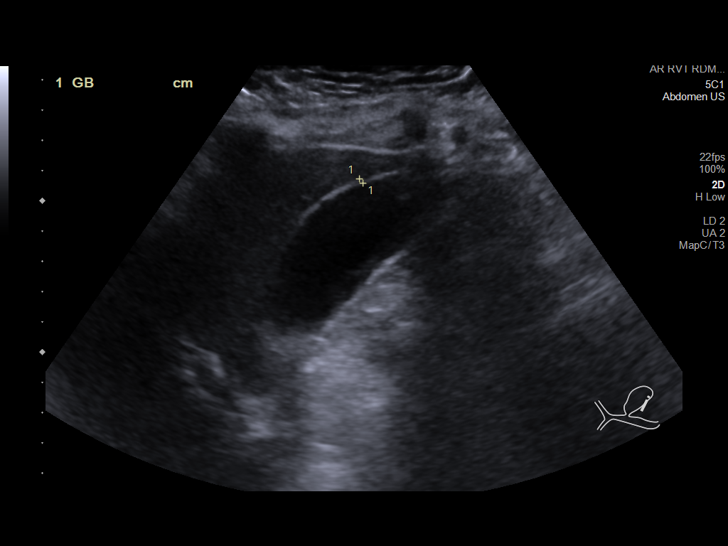
[im 13/51]
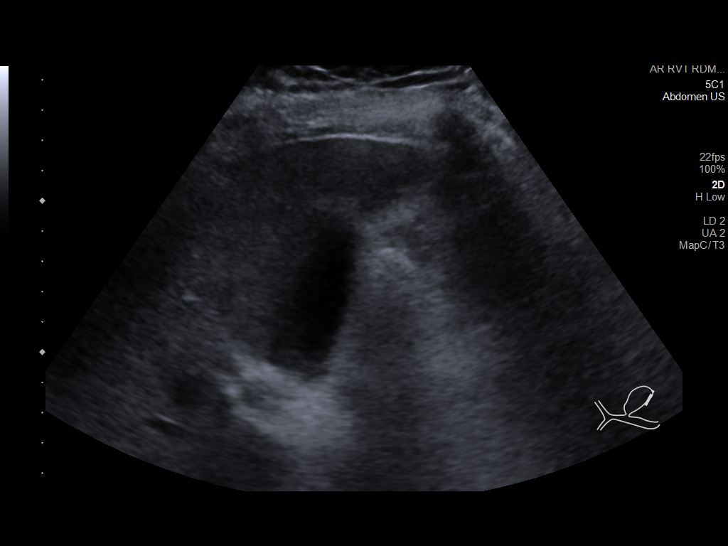
[im 17/51]
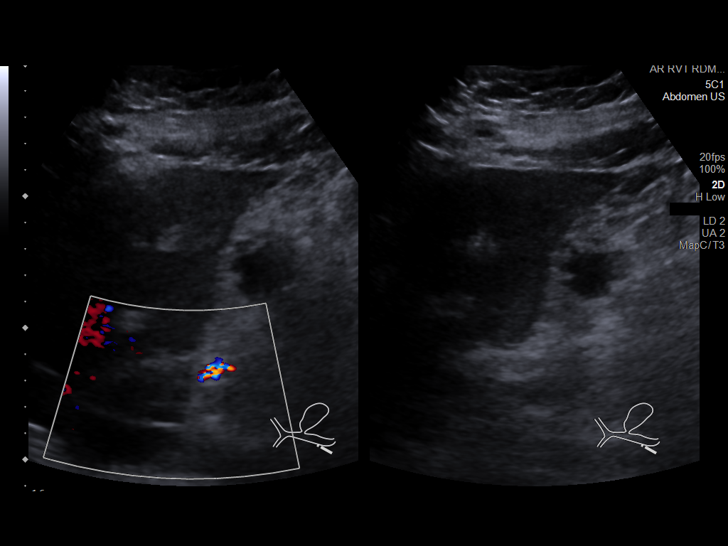
[im 19/51]
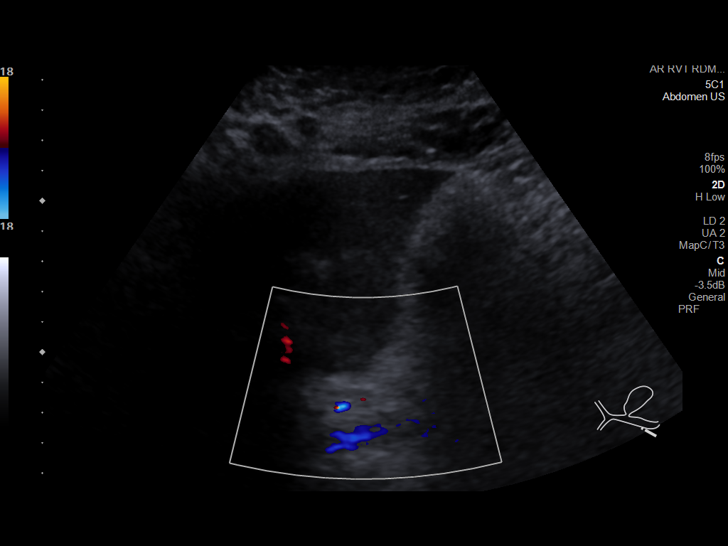
[im 23/51]
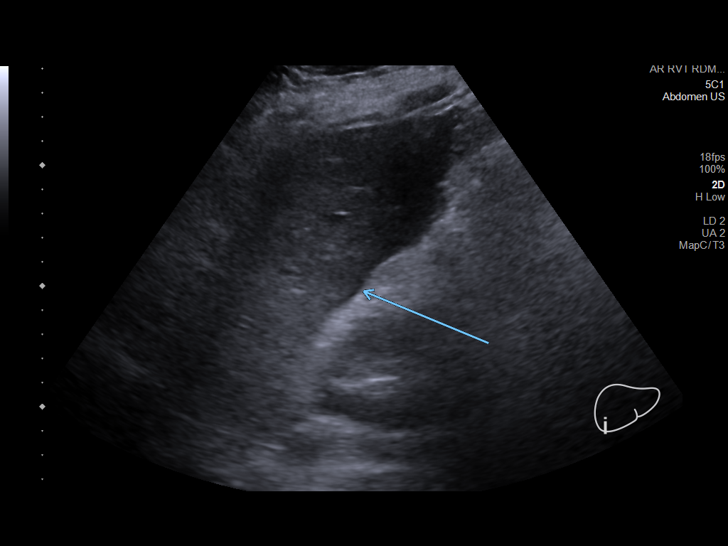
[im 28/51]
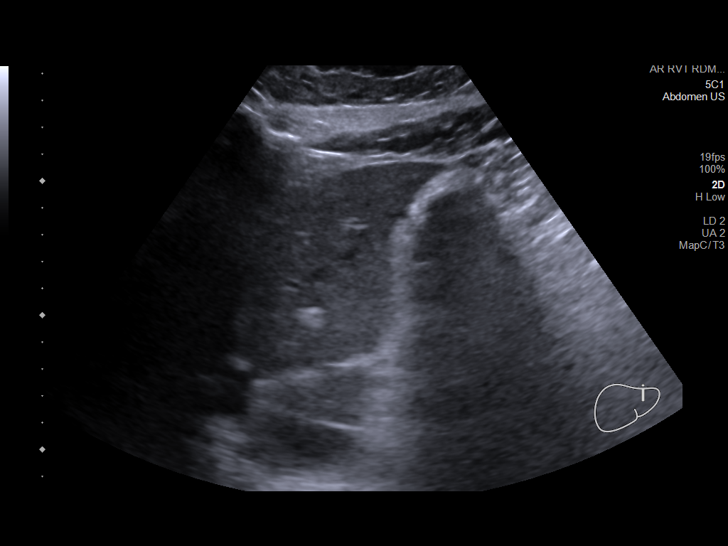
[im 32/51]
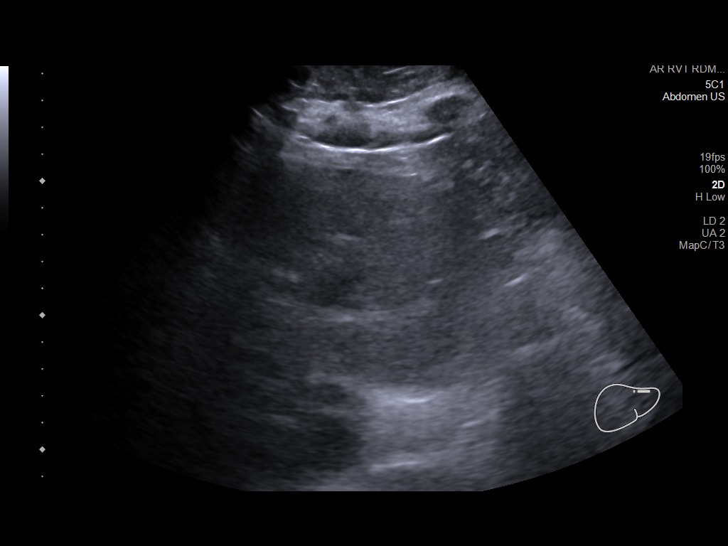
[im 34/51]
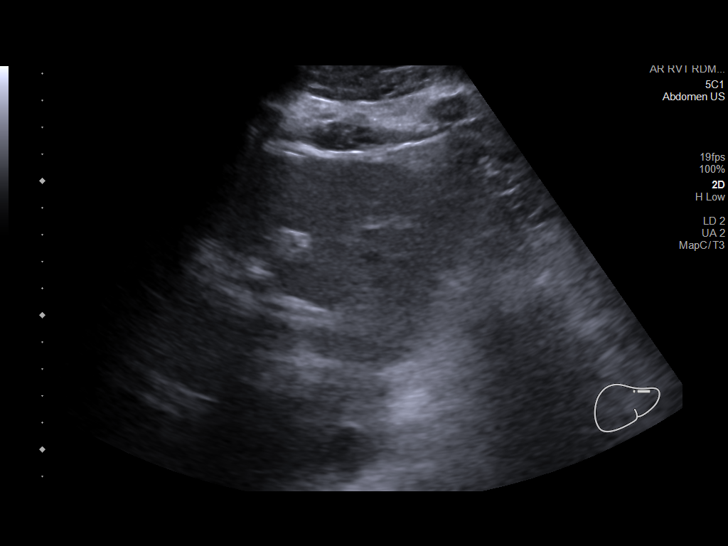
[im 38/51]
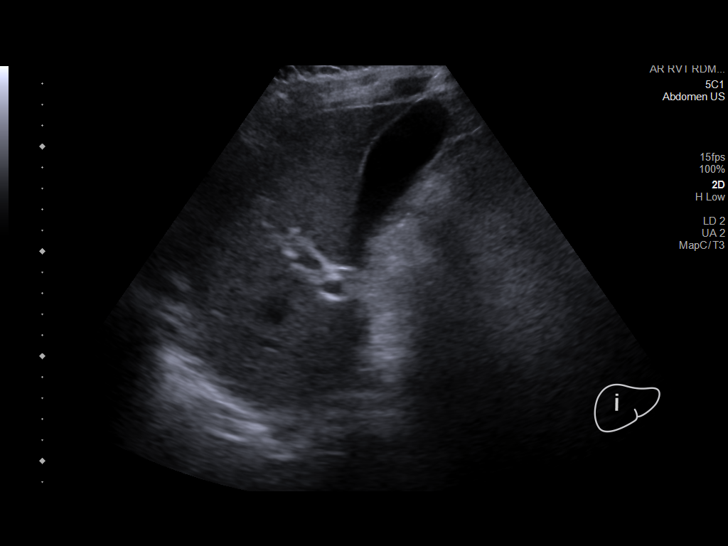
[im 42/51]
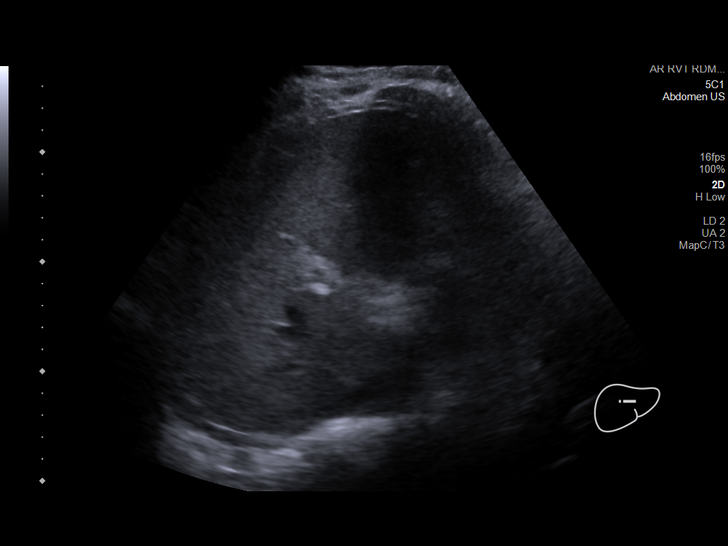
[im 46/51]
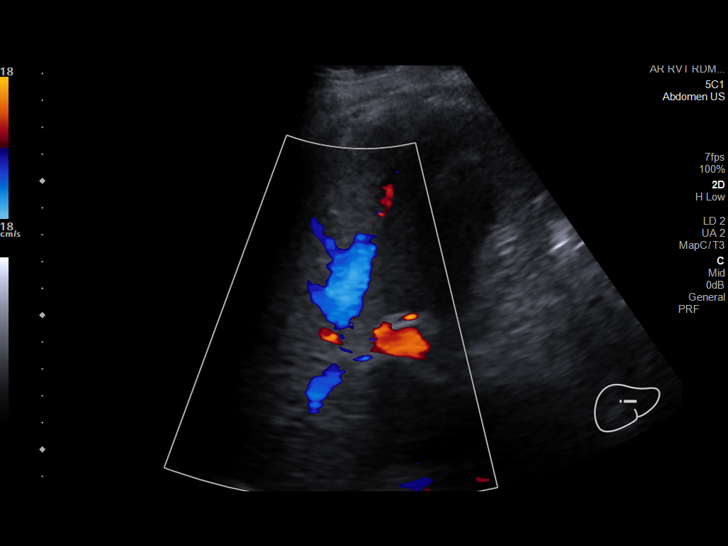
[im 51/51]
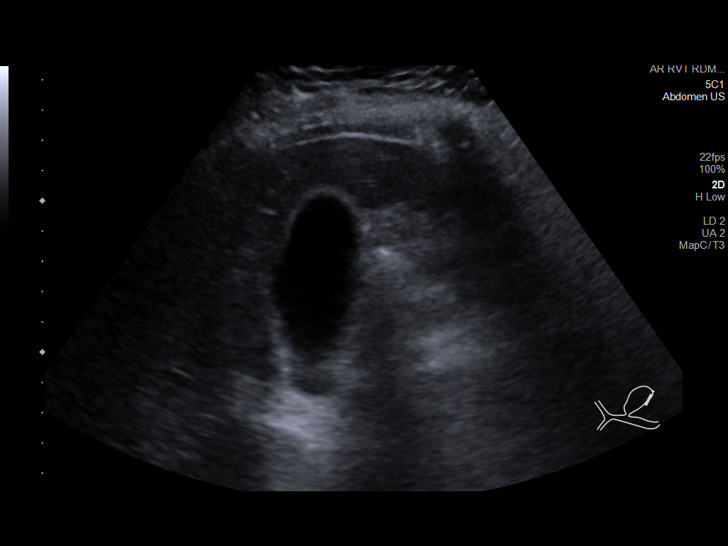

[14 of 25 positions shown; findings below may reference images not displayed]

FINDINGS: Gallbladder:

Normally distended without stones or wall thickening. No
pericholecystic fluid or sonographic Murphy sign.

Common bile duct:

Diameter: 4 mm, normal

Liver:

Echogenic parenchyma, likely fatty infiltration though this can be
seen with cirrhosis and certain infiltrative disorders. No focal
hepatic mass or nodularity. Portal vein is patent on color Doppler
imaging with normal direction of blood flow towards the liver.

Other: Trace perihepatic fluid.  No additional ascites.
IMPRESSION: Probable fatty infiltration of liver as above.

Remainder of exam unremarkable.

## 2023-09-02 ENCOUNTER — Ambulatory Visit: Payer: Managed Care, Other (non HMO) | Admitting: Urology

## 2023-12-11 ENCOUNTER — Telehealth: Payer: Self-pay

## 2023-12-11 NOTE — Telephone Encounter (Signed)
 Patient states her appointment with NP was only to get FMLA papers filled out due to her kidney stones. Patient has not been seen since 12/17/22. Are you willing to do a televisit?

## 2023-12-15 NOTE — Telephone Encounter (Signed)
 Patient called with no answer. Message left to keep scheduled appointment date and time.

## 2023-12-25 ENCOUNTER — Encounter: Payer: Self-pay | Admitting: Urology

## 2023-12-25 ENCOUNTER — Ambulatory Visit: Admitting: Urology

## 2023-12-25 VITALS — BP 108/76 | HR 77

## 2023-12-25 DIAGNOSIS — N200 Calculus of kidney: Secondary | ICD-10-CM | POA: Diagnosis not present

## 2023-12-25 LAB — URINALYSIS, ROUTINE W REFLEX MICROSCOPIC
Bilirubin, UA: NEGATIVE
Glucose, UA: NEGATIVE
Ketones, UA: NEGATIVE
Leukocytes,UA: NEGATIVE
Nitrite, UA: NEGATIVE
Protein,UA: NEGATIVE
Specific Gravity, UA: 1.02 (ref 1.005–1.030)
Urobilinogen, Ur: 0.2 mg/dL (ref 0.2–1.0)
pH, UA: 6 (ref 5.0–7.5)

## 2023-12-25 LAB — MICROSCOPIC EXAMINATION
Bacteria, UA: NONE SEEN
WBC, UA: NONE SEEN /HPF (ref 0–5)

## 2023-12-25 MED ORDER — PROMETHAZINE HCL 12.5 MG PO TABS: 12.5000 mg | ORAL_TABLET | Freq: Four times a day (QID) | ORAL | 0 refills | Status: AC | PRN

## 2023-12-25 MED ORDER — OXYCODONE-ACETAMINOPHEN 5-325 MG PO TABS
1.0000 | ORAL_TABLET | ORAL | 0 refills | Status: AC | PRN
Start: 2023-12-25 — End: ?

## 2023-12-25 NOTE — Progress Notes (Signed)
 12/25/2023 8:42 AM   Stacey Wilson June 22, 1980 [id number]  Referring provider: Alphonsa Glendia LABOR, MD [address] [place],  [place] [address]  Followup nephrolithiasis   HPI: Stacey Wilson is a 43yo here for followup for nephrolithiasis. She passed 4 stone since last visit. UA shows 3-10 RBCs/hpf. She denies nay flank pain currently. She denies any significant LUTS.    PMH: Past Medical History:  Diagnosis Date   Anxiety    History of kidney stones    Renal calculi    bilateral   Urgency of urination    Warts, genital     Surgical History: Past Surgical History:  Procedure Laterality Date   BIOPSY  01/10/2020   Procedure: BIOPSY;  Surgeon: Eartha Angelia Sieving, MD;  Location: [facility];  Service: Gastroenterology;;   BREAST SURGERY Right 2002   benign cyst   COLONOSCOPY WITH PROPOFOL  N/A 01/10/2020   Procedure: COLONOSCOPY WITH PROPOFOL ;  Surgeon: Eartha Angelia Sieving, MD;  Location: [facility];  Service: Gastroenterology;  Laterality: N/A;  730   CYSTOSCOPY W/ URETERAL STENT PLACEMENT Bilateral 05/19/2016   Procedure: CYSTOSCOPY WITH RETROGRADE PYELOGRAM/URETEROSCOPY/STONE EXTRACTION WITH BASKET / STENT PLACEMENT;  Surgeon: Belvie LITTIE Clara, MD;  Location: [facility];  Service: Urology;  Laterality: Bilateral;   CYSTOSCOPY WITH RETROGRADE PYELOGRAM, URETEROSCOPY AND STENT PLACEMENT Bilateral 07/25/2015   Procedure: CYSTOSCOPY WITH BILATERAL RETROGRADE PYELOGRAM, BILATERAL URETEROSCOPY AND BILATERAL STENT PLACEMENT;  Surgeon: Belvie LITTIE Clara, MD;  Location: [facility];  Service: Urology;  Laterality: Bilateral;   CYSTOSCOPY WITH RETROGRADE PYELOGRAM, URETEROSCOPY AND STENT PLACEMENT Left 11/07/2020   Procedure: CYSTOSCOPY WITH RETROGRADE PYELOGRAM, URETEROSCOPY AND STENT PLACEMENT;  Surgeon: Clara Belvie LITTIE, MD;  Location: [facility];  Service: Urology;  Laterality: Left;   STONE EXTRACTION WITH BASKET Bilateral 07/25/2015   Procedure:  BILATERAL RENAL STONE EXTRACTION WITH BASKET;  Surgeon: Belvie LITTIE Clara, MD;  Location: [facility];  Service: Urology;  Laterality: Bilateral;    Home Medications:  Allergies as of 12/25/2023   No Known Allergies      Medication List        Accurate as of December 25, 2023  8:42 AM. If you have any questions, ask your nurse or doctor.          clonazePAM  0.5 MG tablet Commonly known as: KLONOPIN  Take 1/2-1 tab po BID prn anxiety   fluticasone  50 MCG/ACT nasal spray Commonly known as: FLONASE  Place into both nostrils daily.   loratadine 10 MG tablet Commonly known as: CLARITIN Take 10 mg by mouth daily.   multivitamin tablet Take 1 tablet by mouth daily.   norethindrone  0.35 MG tablet Commonly known as: Incassia  Take 1 tablet (0.35 mg total) by mouth daily.   omeprazole 20 MG capsule Commonly known as: PRILOSEC Take 20 mg by mouth daily.   promethazine  12.5 MG tablet Commonly known as: PHENERGAN  Take 12.5 mg by mouth every 6 (six) hours as needed for nausea or vomiting.   SENNA LAX PO Take by mouth.   Vitamin C 500 MG Caps Take 500 mg by mouth daily.        Allergies: No Known Allergies  Family History: No family history on file.  Social History:  reports that she has been smoking cigarettes. She has a 7.5 pack-year smoking history. She has never used smokeless tobacco. She reports current alcohol use of about 1.0 standard drink of alcohol per week. She reports that she does not use drugs.  ROS: All  other review of systems were reviewed and are negative except what is noted above in HPI  Physical Exam: BP 108/76   Pulse 77   Constitutional:  Alert and oriented, No acute distress. HEENT: [place] AT, moist mucus membranes.  Trachea midline, no masses. Cardiovascular: No clubbing, cyanosis, or edema. Respiratory: Normal respiratory effort, no increased work of breathing. GI: Abdomen is soft, nontender, nondistended, no abdominal masses GU: No CVA  tenderness.  Lymph: No cervical or inguinal lymphadenopathy. Skin: No rashes, bruises or suspicious lesions. Neurologic: Grossly intact, no focal deficits, moving all 4 extremities. Psychiatric: Normal mood and affect.  Laboratory Data: Lab Results  Component Value Date   WBC 6.6 04/14/2022   HGB 13.2 04/14/2022   HCT 38.8 04/14/2022   MCV 88.4 04/14/2022   PLT 232 04/14/2022    Lab Results  Component Value Date   CREATININE 0.61 04/14/2022    No results found for: PSA  No results found for: TESTOSTERONE  No results found for: HGBA1C  Urinalysis    Component Value Date/Time   COLORURINE YELLOW 01/16/2021 1445   APPEARANCEUR HAZY (A) 01/16/2021 1445   APPEARANCEUR Clear 10/10/2020 1046   LABSPEC >1.030 (H) 01/16/2021 1445   PHURINE 5.5 01/16/2021 1445   GLUCOSEU NEGATIVE 01/16/2021 1445   HGBUR MODERATE (A) 01/16/2021 1445   BILIRUBINUR NEGATIVE 01/16/2021 1445   BILIRUBINUR Negative 10/10/2020 1046   KETONESUR NEGATIVE 01/16/2021 1445   PROTEINUR TRACE (A) 01/16/2021 1445   NITRITE NEGATIVE 01/16/2021 1445   LEUKOCYTESUR NEGATIVE 01/16/2021 1445    Lab Results  Component Value Date   LABMICR See below: 10/10/2020   WBCUA 0-5 10/10/2020   LABEPIT 0-10 10/10/2020   MUCUS Present 10/10/2020   BACTERIA MANY (A) 01/16/2021    Pertinent Imaging: CT 12/18/2022: Images reviewed and discussed with the patient  No results found for this or any previous visit.  No results found for this or any previous visit.  No results found for this or any previous visit.  No results found for this or any previous visit.  Results for orders placed in visit on 09/02/22  Ultrasound renal complete  Narrative CLINICAL DATA:  Nephrolithiasis follow-up  EXAM: RENAL / URINARY TRACT ULTRASOUND COMPLETE  COMPARISON:  CT abdomen and pelvis dated January 16, 2021  FINDINGS: Right Kidney:  Renal measurements: 11.8 x 5.6 x 6.8 cm = volume: 132.2 mL. Echogenicity  within normal limits. No mass or hydronephrosis visualized. Multiple calculi, largest is located in the upper pole and measures 7 mm.  Left Kidney:  Renal measurements: 12.1 x 6.8 x 5.6 cm = volume: 237.9 mL. Echogenicity within normal limits. No mass or hydronephrosis visualized. Lower pole calculus measuring 10 mm.  Bladder:  Appears normal for degree of bladder distention. Bladder jets visualized bilaterally.  Other:  None.  IMPRESSION: 1. Bilateral nephrolithiasis. 2. No hydronephrosis.   Electronically Signed By: Rea Marc M.D. On: 09/02/2022 15:41  No results found for this or any previous visit.  No results found for this or any previous visit.  Results for orders placed in visit on 12/17/22  CT RENAL STONE STUDY  Narrative CLINICAL DATA:  Intermittent left upper quadrant pain. Nephrolithiasis.  EXAM: CT ABDOMEN AND PELVIS WITHOUT CONTRAST  TECHNIQUE: Multidetector CT imaging of the abdomen and pelvis was performed following the standard protocol without IV contrast.  RADIATION DOSE REDUCTION: This exam was performed according to the departmental dose-optimization program which includes automated exposure control, adjustment of the mA and/or kV according to patient  size and/or use of iterative reconstruction technique.  COMPARISON:  01/16/2021  FINDINGS: Lower chest: No acute findings.  Hepatobiliary: No mass visualized on this unenhanced exam. Gallbladder is unremarkable. No evidence of biliary ductal dilatation.  Pancreas: No mass or inflammatory process visualized on this unenhanced exam.  Spleen:  Within normal limits in size.  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 No evidence of ureteral calculi or hydronephrosis. Unremarkable unopacified urinary bladder.  Stomach/Bowel: No evidence of obstruction, inflammatory process, or abnormal  fluid collections. Normal appendix visualized.  Vascular/Lymphatic: No pathologically enlarged lymph nodes identified. No evidence of abdominal aortic aneurysm.  Reproductive:  No mass or other significant abnormality.  Other: Soft tissue stranding is seen in the right lateral abdominal wall subcutaneous fat with multiple tiny air bubbles. No masses or fluid collections identified at this site. Differential diagnosis includes cellulitis, recent injection, or other recent trauma.  Musculoskeletal:  No suspicious bone lesions identified.  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 No evidence of ureteral calculi or hydronephrosis.  Soft tissue stranding and tiny air bubbles in right lateral abdominal wall subcutaneous fat. Differential diagnosis includes cellulitis, recent injection, or other recent trauma. Clinical correlation recommended.   Electronically Signed By: Norleen DELENA Kil M.D. On: 12/18/2022 13:22   Assessment & Plan:    1. Kidney stones (Primary) -KUb in 1 year  - Urinalysis, Routine w reflex microscopic   No follow-ups on file.  Belvie Clara, MD  [facility] [place]

## 2023-12-25 NOTE — Patient Instructions (Signed)

## 2023-12-27 ENCOUNTER — Other Ambulatory Visit: Payer: Self-pay | Admitting: Adult Health

## 2024-04-13 ENCOUNTER — Other Ambulatory Visit: Payer: Self-pay | Admitting: Family Medicine

## 2024-04-13 ENCOUNTER — Encounter: Payer: Self-pay | Admitting: Family Medicine

## 2024-04-13 MED ORDER — CLONAZEPAM 0.5 MG PO TABS: 0.5000 mg | ORAL_TABLET | Freq: Two times a day (BID) | ORAL | 0 refills | Status: AC | PRN

## 2024-12-23 ENCOUNTER — Ambulatory Visit: Admitting: Urology
